# Patient Record
Sex: Male | Born: 1975 | Race: White | Hispanic: No | Marital: Married | Smoking: Former smoker
Health system: Southern US, Community
[De-identification: ages and names within clinical notes are randomized; demographics above are authoritative.]

## PROBLEM LIST (undated history)

## (undated) DIAGNOSIS — R569 Unspecified convulsions: Secondary | ICD-10-CM

## (undated) DIAGNOSIS — F32A Depression, unspecified: Secondary | ICD-10-CM

## (undated) DIAGNOSIS — F1010 Alcohol abuse, uncomplicated: Secondary | ICD-10-CM

## (undated) DIAGNOSIS — F10230 Alcohol dependence with withdrawal, uncomplicated: Secondary | ICD-10-CM

## (undated) DIAGNOSIS — F329 Major depressive disorder, single episode, unspecified: Secondary | ICD-10-CM

## (undated) HISTORY — DX: Depression, unspecified: F32.A

## (undated) HISTORY — DX: Major depressive disorder, single episode, unspecified: F32.9

## (undated) HISTORY — PX: NO PAST SURGERIES: SHX2092

---

## 2016-01-21 ENCOUNTER — Emergency Department (HOSPITAL_COMMUNITY): Payer: Self-pay

## 2016-01-21 ENCOUNTER — Encounter (HOSPITAL_COMMUNITY): Payer: Self-pay | Admitting: Emergency Medicine

## 2016-01-21 ENCOUNTER — Inpatient Hospital Stay (HOSPITAL_COMMUNITY)
Admission: EM | Admit: 2016-01-21 | Discharge: 2016-01-27 | DRG: 896 | Disposition: A | Discharge status: Home Health | Payer: Self-pay | Attending: Internal Medicine | Admitting: Internal Medicine

## 2016-01-21 DIAGNOSIS — E876 Hypokalemia: Secondary | ICD-10-CM

## 2016-01-21 DIAGNOSIS — F1020 Alcohol dependence, uncomplicated: Secondary | ICD-10-CM | POA: Diagnosis present

## 2016-01-21 DIAGNOSIS — Z781 Physical restraint status: Secondary | ICD-10-CM

## 2016-01-21 DIAGNOSIS — K760 Fatty (change of) liver, not elsewhere classified: Secondary | ICD-10-CM | POA: Diagnosis present

## 2016-01-21 DIAGNOSIS — R001 Bradycardia, unspecified: Secondary | ICD-10-CM | POA: Diagnosis present

## 2016-01-21 DIAGNOSIS — R0781 Pleurodynia: Secondary | ICD-10-CM | POA: Diagnosis present

## 2016-01-21 DIAGNOSIS — R7401 Elevation of levels of liver transaminase levels: Secondary | ICD-10-CM

## 2016-01-21 DIAGNOSIS — F329 Major depressive disorder, single episode, unspecified: Secondary | ICD-10-CM | POA: Diagnosis present

## 2016-01-21 DIAGNOSIS — R569 Unspecified convulsions: Secondary | ICD-10-CM | POA: Diagnosis present

## 2016-01-21 DIAGNOSIS — F17210 Nicotine dependence, cigarettes, uncomplicated: Secondary | ICD-10-CM | POA: Diagnosis present

## 2016-01-21 DIAGNOSIS — Z7982 Long term (current) use of aspirin: Secondary | ICD-10-CM

## 2016-01-21 DIAGNOSIS — F10230 Alcohol dependence with withdrawal, uncomplicated: Secondary | ICD-10-CM

## 2016-01-21 DIAGNOSIS — R441 Visual hallucinations: Secondary | ICD-10-CM | POA: Diagnosis present

## 2016-01-21 DIAGNOSIS — Z79899 Other long term (current) drug therapy: Secondary | ICD-10-CM

## 2016-01-21 DIAGNOSIS — F419 Anxiety disorder, unspecified: Secondary | ICD-10-CM | POA: Diagnosis present

## 2016-01-21 DIAGNOSIS — R05 Cough: Secondary | ICD-10-CM | POA: Diagnosis present

## 2016-01-21 DIAGNOSIS — G4700 Insomnia, unspecified: Secondary | ICD-10-CM | POA: Diagnosis present

## 2016-01-21 DIAGNOSIS — R740 Nonspecific elevation of levels of transaminase and lactic acid dehydrogenase [LDH]: Secondary | ICD-10-CM

## 2016-01-21 DIAGNOSIS — R451 Restlessness and agitation: Secondary | ICD-10-CM | POA: Diagnosis present

## 2016-01-21 DIAGNOSIS — F10231 Alcohol dependence with withdrawal delirium: Principal | ICD-10-CM | POA: Diagnosis present

## 2016-01-21 DIAGNOSIS — R059 Cough, unspecified: Secondary | ICD-10-CM

## 2016-01-21 DIAGNOSIS — R000 Tachycardia, unspecified: Secondary | ICD-10-CM

## 2016-01-21 DIAGNOSIS — G9340 Encephalopathy, unspecified: Secondary | ICD-10-CM | POA: Diagnosis present

## 2016-01-21 DIAGNOSIS — D696 Thrombocytopenia, unspecified: Secondary | ICD-10-CM | POA: Diagnosis present

## 2016-01-21 DIAGNOSIS — F10931 Alcohol use, unspecified with withdrawal delirium: Secondary | ICD-10-CM

## 2016-01-21 HISTORY — DX: Alcohol dependence with withdrawal, uncomplicated: F10.230

## 2016-01-21 HISTORY — DX: Alcohol abuse, uncomplicated: F10.10

## 2016-01-21 HISTORY — DX: Unspecified convulsions: R56.9

## 2016-01-21 LAB — I-STAT TROPONIN, ED: TROPONIN I, POC: 0.01 ng/mL (ref 0.00–0.08)

## 2016-01-21 LAB — COMPREHENSIVE METABOLIC PANEL
ALK PHOS: 53 U/L (ref 38–126)
ALT: 99 U/L — AB (ref 17–63)
AST: 169 U/L — ABNORMAL HIGH (ref 15–41)
Albumin: 4.3 g/dL (ref 3.5–5.0)
Anion gap: 13 (ref 5–15)
BILIRUBIN TOTAL: 1.3 mg/dL — AB (ref 0.3–1.2)
BUN: 8 mg/dL (ref 6–20)
CALCIUM: 9.8 mg/dL (ref 8.9–10.3)
CO2: 25 mmol/L (ref 22–32)
CREATININE: 0.92 mg/dL (ref 0.61–1.24)
Chloride: 99 mmol/L — ABNORMAL LOW (ref 101–111)
Glucose, Bld: 92 mg/dL (ref 65–99)
Potassium: 3.2 mmol/L — ABNORMAL LOW (ref 3.5–5.1)
Sodium: 137 mmol/L (ref 135–145)
TOTAL PROTEIN: 7.4 g/dL (ref 6.5–8.1)

## 2016-01-21 LAB — CBC
HEMATOCRIT: 38.6 % — AB (ref 39.0–52.0)
HEMOGLOBIN: 13 g/dL (ref 13.0–17.0)
MCH: 33.7 pg (ref 26.0–34.0)
MCHC: 33.7 g/dL (ref 30.0–36.0)
MCV: 100 fL (ref 78.0–100.0)
PLATELETS: 128 10*3/uL — AB (ref 150–400)
RBC: 3.86 MIL/uL — AB (ref 4.22–5.81)
RDW: 12 % (ref 11.5–15.5)
WBC: 6 10*3/uL (ref 4.0–10.5)

## 2016-01-21 LAB — ETHANOL

## 2016-01-21 MED ORDER — LORAZEPAM 2 MG/ML IJ SOLN: 1.0000 mg | Freq: Four times a day (QID) | INTRAMUSCULAR | Status: DC | PRN

## 2016-01-21 MED ORDER — ACETAMINOPHEN 325 MG PO TABS: 650.0000 mg | ORAL_TABLET | Freq: Four times a day (QID) | ORAL | Status: DC | PRN

## 2016-01-21 MED ORDER — LORAZEPAM 2 MG/ML IJ SOLN: 0.0000 mg | Freq: Four times a day (QID) | INTRAMUSCULAR | Status: DC

## 2016-01-21 MED ORDER — FOLIC ACID 1 MG PO TABS: 1.0000 mg | ORAL_TABLET | Freq: Once | ORAL | Status: DC

## 2016-01-21 MED ORDER — ONDANSETRON HCL 4 MG/2ML IJ SOLN: 4.0000 mg | Freq: Four times a day (QID) | INTRAMUSCULAR | Status: DC | PRN

## 2016-01-21 MED ORDER — LORAZEPAM 2 MG/ML IJ SOLN
2.0000 mg | INTRAMUSCULAR | Status: DC | PRN
  Administered 2016-01-22 (×2): 2 mg via INTRAVENOUS
  Administered 2016-01-22: 3 mg via INTRAVENOUS
  Administered 2016-01-22 – 2016-01-23 (×3): 2 mg via INTRAVENOUS
  Administered 2016-01-23: 3 mg via INTRAVENOUS
  Administered 2016-01-23: 2 mg via INTRAVENOUS
  Filled 2016-01-21 (×2): qty 1
  Filled 2016-01-21: qty 2
  Filled 2016-01-21: qty 1
  Filled 2016-01-21: qty 2
  Filled 2016-01-21 (×2): qty 1
  Filled 2016-01-21: qty 2
  Filled 2016-01-21: qty 1

## 2016-01-21 MED ORDER — SODIUM CHLORIDE 0.9 % IV BOLUS (SEPSIS)
1000.0000 mL | Freq: Once | INTRAVENOUS | Status: AC
  Administered 2016-01-21: 1000 mL via INTRAVENOUS

## 2016-01-21 MED ORDER — THIAMINE HCL 100 MG/ML IJ SOLN
100.0000 mg | Freq: Every day | INTRAMUSCULAR | Status: DC
  Administered 2016-01-22 – 2016-01-24 (×3): 100 mg via INTRAVENOUS
  Filled 2016-01-21 (×5): qty 2

## 2016-01-21 MED ORDER — LORAZEPAM 2 MG/ML IJ SOLN: 0.0000 mg | Freq: Two times a day (BID) | INTRAMUSCULAR | Status: DC

## 2016-01-21 MED ORDER — ONDANSETRON HCL 4 MG PO TABS: 4.0000 mg | ORAL_TABLET | Freq: Four times a day (QID) | ORAL | Status: DC | PRN

## 2016-01-21 MED ORDER — SODIUM CHLORIDE 0.9% FLUSH
3.0000 mL | Freq: Two times a day (BID) | INTRAVENOUS | Status: DC
  Administered 2016-01-22 – 2016-01-27 (×8): 3 mL via INTRAVENOUS

## 2016-01-21 MED ORDER — THIAMINE HCL 100 MG/ML IJ SOLN: 100.0000 mg | Freq: Once | INTRAMUSCULAR | Status: DC

## 2016-01-21 MED ORDER — ENOXAPARIN SODIUM 40 MG/0.4ML ~~LOC~~ SOLN
40.0000 mg | Freq: Every day | SUBCUTANEOUS | Status: DC
  Administered 2016-01-21 – 2016-01-24 (×4): 40 mg via SUBCUTANEOUS
  Filled 2016-01-21 (×4): qty 0.4

## 2016-01-21 MED ORDER — LORAZEPAM 1 MG PO TABS: 1.0000 mg | ORAL_TABLET | Freq: Four times a day (QID) | ORAL | Status: DC | PRN

## 2016-01-21 MED ORDER — FOLIC ACID 5 MG/ML IJ SOLN
1.0000 mg | Freq: Every day | INTRAMUSCULAR | Status: DC
  Administered 2016-01-22 – 2016-01-24 (×2): 1 mg via INTRAVENOUS
  Filled 2016-01-21 (×7): qty 0.2

## 2016-01-21 MED ORDER — ACETAMINOPHEN 650 MG RE SUPP: 650.0000 mg | Freq: Four times a day (QID) | RECTAL | Status: DC | PRN

## 2016-01-21 MED ORDER — ADULT MULTIVITAMIN W/MINERALS CH
1.0000 | ORAL_TABLET | Freq: Once | ORAL | Status: DC
Start: 2016-01-21 — End: 2016-01-21

## 2016-01-21 MED ORDER — LACTATED RINGERS IV SOLN
INTRAVENOUS | Status: DC
  Administered 2016-01-21: 23:00:00 via INTRAVENOUS

## 2016-01-21 MED ORDER — POTASSIUM CHLORIDE CRYS ER 20 MEQ PO TBCR
40.0000 meq | EXTENDED_RELEASE_TABLET | Freq: Once | ORAL | Status: DC
Start: 2016-01-21 — End: 2016-01-21

## 2016-01-21 MED ORDER — SODIUM CHLORIDE 0.9 % IV SOLN: Freq: Once | INTRAVENOUS | Status: DC

## 2016-01-21 MED ORDER — LORAZEPAM 2 MG/ML IJ SOLN
1.0000 mg | Freq: Once | INTRAMUSCULAR | Status: AC
  Administered 2016-01-21: 1 mg via INTRAVENOUS
  Filled 2016-01-21: qty 1

## 2016-01-21 MED ORDER — POTASSIUM CHLORIDE CRYS ER 20 MEQ PO TBCR
40.0000 meq | EXTENDED_RELEASE_TABLET | Freq: Once | ORAL | Status: AC
  Administered 2016-01-21: 40 meq via ORAL
  Filled 2016-01-21: qty 2

## 2016-01-21 NOTE — ED Provider Notes (Signed)
[facility]-EMERGENCY DEPT Provider Note   CSN: [id number] Arrival date & time: 01/21/16  1431     History   Chief Complaint Chief Complaint  Patient presents with  . Seizures    HPI Timothy [name] is a 40 y.o. male.  HPI Timothy [name] is a 40 y.o. male with hx of ETOH abuse and hx of one prior alcohol withdrawal seizure in 2004, presents to ED with complaint of a seizure. Pt states he was at work and remembers feeling hungry and realizing he has had nothing to eat today. States went to get food when he had a seizure. Pt with tonic clonic activity lasting several minutes. States pt remembers when EMS already on the scene taking him to the hospital. Pt did have positive urinary incontinence. Pt is postictal after his seizure. Pt complaining of chest pain. States "feels like someone hit him in the chest." pr reports that he still drinks alcohol daily. Pt's father pulled us into the hallway and told us that pt is a very heavy drinker. PT states over the weekend he has cut down on alcohol due to being sick with the flu like symptoms. States he had nausea, vomiting and he hasnt been able to drink as much. States vomiting improved yesterday but states he did keep few drinks down last night. States was feeling OK this morning.   Past Medical History:  Diagnosis Date  . ETOH abuse   . Seizure due to alcohol withdrawal, uncomplicated (HCC)    2004  . Seizures (HCC)     There are no active problems to display for this patient.   History reviewed. No pertinent surgical history.     Home Medications    Prior to Admission medications   Not on File    Family History No family history on file.  Social History Social History  Substance Use Topics  . Smoking status: Current Some Day Smoker  . Smokeless tobacco: Current User    Types: Chew  . Alcohol use 27.0 oz/week    45 Cans of beer per week     Allergies   Patient has no known allergies.   Review of  Systems Review of Systems  Constitutional: Negative for chills and fever.  Respiratory: Negative for cough, chest tightness and shortness of breath.   Cardiovascular: Positive for chest pain. Negative for palpitations and leg swelling.  Gastrointestinal: Negative for abdominal distention, abdominal pain, diarrhea, nausea and vomiting.  Genitourinary: Negative for dysuria, frequency, hematuria and urgency.  Musculoskeletal: Negative for arthralgias, myalgias, neck pain and neck stiffness.  Skin: Negative for rash.  Allergic/Immunologic: Negative for immunocompromised state.  Neurological: Positive for tremors and seizures. Negative for dizziness, weakness, light-headedness, numbness and headaches.  All other systems reviewed and are negative.    Physical Exam Updated Vital Signs BP 115/86 (BP Location: Right Arm)   Pulse 102   Temp 98.1 F (36.7 C) (Oral)   Resp 13   Ht 5\' 8"  (1.727 m)   Wt 62.1 kg   SpO2 100%   BMI 20.83 kg/m   Physical Exam  Constitutional: He appears well-developed and well-nourished. No distress.  HENT:  Head: Normocephalic and atraumatic.  Eyes: Conjunctivae and EOM are normal. Pupils are equal, round, and reactive to light.  Neck: Normal range of motion. Neck supple.  Cardiovascular: Normal rate, regular rhythm and normal heart sounds.   Pulmonary/Chest: Effort normal. No respiratory distress. He has no wheezes. He has no rales.  Abdominal: Soft. Bowel sounds  are normal. He exhibits no distension. There is no tenderness. There is no rebound.  Musculoskeletal: He exhibits no edema.  Neurological: He is alert.  Intentional tremmor in both hands and legs. 5/5 and equal upper and lower extremity strength bilaterally. Equal grip strength bilaterally. Normal finger to nose and heel to shin. No pronator drift. Patellar reflexes 2+   Skin: Skin is warm and dry.  Nursing note and vitals reviewed.    ED Treatments / Results  Labs (all labs ordered are  listed, but only abnormal results are displayed) Labs Reviewed  CBC - Abnormal; Notable for the following:       Result Value   RBC 3.86 (*)    HCT 38.6 (*)    Platelets 128 (*)    All other components within normal limits  COMPREHENSIVE METABOLIC PANEL - Abnormal; Notable for the following:    Potassium 3.2 (*)    Chloride 99 (*)    AST 169 (*)    ALT 99 (*)    Total Bilirubin 1.3 (*)    All other components within normal limits  ETHANOL  CBG MONITORING, ED  Rosezena SensorI-STAT TROPOININ, ED    EKG  EKG Interpretation  Date/Time:  Tuesday January 21 2016 14:34:37 EST Ventricular Rate:  100 PR Interval:    QRS Duration: 85 QT Interval:  337 QTC Calculation: 435 R Axis:   63 Text Interpretation:  Sinus tachycardia Confirmed by Particia NearingHAVILAND MD, JULIE ([id number]) on 01/21/2016 6:03:05 PM       Radiology Dg Chest 2 View  Result Date: 01/21/2016 CLINICAL DATA:  The patient reports 4-5 days pleuritic chest pain radiating to the shoulder blades. Current smoker. Patient has history of seizure activity and a seizure this afternoon. EXAM: CHEST  2 VIEW COMPARISON:  None in PACs FINDINGS: The lungs are well-expanded and clear. There is no pneumothorax, pneumomediastinum, or pleural effusion. There is no evidence of aspiration. The heart and pulmonary vascularity are normal. The mediastinum is normal in width. The bony thorax exhibits no acute abnormality. IMPRESSION: There is no active cardiopulmonary disease. Electronically Signed   By: David  SwazilandJordan M.D.   On: 01/21/2016 16:35    Procedures Procedures (including critical care time)  Medications Ordered in ED Medications  multivitamin with minerals tablet 1 tablet (not administered)  thiamine (B-1) injection 100 mg (not administered)  folic acid (FOLVITE) tablet 1 mg (not administered)  potassium chloride SA (K-DUR,KLOR-CON) CR tablet 40 mEq (not administered)  0.9 %  sodium chloride infusion (not administered)  LORazepam (ATIVAN) tablet 1 mg  (not administered)    Or  LORazepam (ATIVAN) injection 1 mg (not administered)  LORazepam (ATIVAN) injection 0-4 mg (not administered)    Followed by  LORazepam (ATIVAN) injection 0-4 mg (not administered)  sodium chloride 0.9 % bolus 1,000 mL (1,000 mLs Intravenous New Bag/Given 01/21/16 1614)  LORazepam (ATIVAN) injection 1 mg (1 mg Intravenous Given 01/21/16 1609)  sodium chloride 0.9 % bolus 1,000 mL (1,000 mLs Intravenous New Bag/Given 01/21/16 1607)     Initial Impression / Assessment and Plan / ED Course  I have reviewed the triage vital signs and the nursing notes.  Pertinent labs & imaging results that were available during my care of the patient were reviewed by me and considered in my medical decision making (see chart for details).  Clinical Course     Pt in ED with seizure episode. Hx of heavy alcohol use with recent decrease in use due to flu like illness. Last drink  last night. Pt is tremulous, tachycardic, otherwise non toxic appearing. Will give fluids, CIWA, ativan for tremors and suspected withdrawal. Will give thiamine and folic acid. Will monitor. Labs and CXR for chest pain ordered.   5:48 PM Labs showing mildly elevated LFTs and bili, most likely due to his drinking. His potassium 3.2, given in ED. Pt feels better after ativan. Still with mild tremor. Pt does have some tremor at baseline according to dad. Patient has no evidence of hallucination, not tachycardic or hypertensive any longer after hydration, I do not think he is in DTs. We had a very long discussion about alcohol cessation, and patient stated that he is just not ready to quit drinking just yet. He was given option of admission for detox versus outpatient resources. He initially decided that he wanted to leave, however I was called back to the room by family stating that he is ready to be admitted. When I talked to them about admission again, and that admission would be for few days, patient again  refused.  Patient changed his mind and stated that he is ready to quit drinking. Given his history of seizures with withdraws, he will need inpatient admission. Spoke which I have, they will admit patient to stepdown. The patient was placed on CIWA protocol.   Final Clinical Impressions(s) / ED Diagnoses   Final diagnoses:  Alcohol withdrawal seizure without complication [facility])    New Prescriptions New Prescriptions   No medications on file     Jaynie Crumble, PA-C 01/21/16 2005    Jacalyn Lefevre, MD 01/21/16 2139

## 2016-01-21 NOTE — ED Triage Notes (Signed)
Pt arrives from work vis [facility] reporting clonic tonic seizure lasting 3-5 minutes.  EMS reports onset was witnessed, pt lowered from chair to floor with no fall or trauma.  EMS reports post-ictal period lasted approx 25 minutes.  Urinary incontinence noted.  Pt reports previous seizure r/t ETOH withdrawal, states last drink last night. Pt denies change to ETOH use.  Pt AOx4, disoriented to age. Resp e/u, NAD noted at this time.

## 2016-01-21 NOTE — H&P (Signed)
History and Physical    Timothy [name] ZOX:[id number] DOB: Oct 18, 1975 DOA: 01/21/2016  PCP: Pcp Not In System Consultants:  None Patient coming from: home - lives with wife; NOK: wife, [phone]  Chief Complaint: seizure  HPI: Timothy [name] is a 40 y.o. male with medical history significant of alcohol dependence and 1 prior alcohol withdrawal seizure presenting with the same.  Patient had a seizure at work today.  Co-workers reported that it was very intense.  Frothing at the mouth, grunting, "stiff as a board but flopping like a fish."  The last thing he remembers was taking a break for lunch and he stood up and his training partner asked a question (he doesn't remember anything after that).  Remembers waking up after the seizure when EMS showed up.  Remembers the ride over but wasn't answering questions correctly at that time.  Denies aura.  Last night at home, had some audio hallucinations.  Also with some visual hallucinations.    Generally drinks 4-6 beers per night Mon-Fri.  Saturdays, 12-15 beers and also on Sunday.  Recently had flu-like illness - unable to work Thursday-Friday, very sick Saturday-Sunday.  Did not drink ETOH at all Monday, but tried drinking through the weekend.  Slept through the day yesterday, trying to get back to work today.    Has had previous seizure about 6 years ago, just once.  Then, heavy use of vodka.  Was on family vacation and decided to stop drinking.  Was hearing music in the walls and unplugged everything but couldn't get the music to stop.  Was treated at the ER in [place] that time.  Went to rehab at [facility] once.  Stayed 2 nights, and things went very well.  No h/o long-term rehab.  Has never been involved in AA.   ED Course: Per PA-C Kirichenko: Pt in ED with seizure episode. Hx of heavy alcohol use with recent decrease in use due to flu like illness. Last drink last night. Pt is tremulous, tachycardic, otherwise non toxic  appearing. Will give fluids, CIWA, ativan for tremors and suspected withdrawal. Will give thiamine and folic acid. Will monitor. Labs and CXR for chest pain ordered.  5:48 PM  Labs showing mildly elevated LFTs and bili, most likely due to his drinking. His potassium 3.2, given in ED. Pt feels better after ativan. Still with mild tremor. Pt does have some tremor at baseline according to dad. Patient has no evidence of hallucination, not tachycardic or hypertensive any longer after hydration, I do not think he is in DTs. We had a very long discussion about alcohol cessation, and patient stated that he is just not ready to quit drinking just yet. He was given option of admission for detox versus outpatient resources. He initially decided that he wanted to leave, however I was called back to the room by family stating that he is ready to be admitted. When I talked to them about admission again, and that admission would be for few days, patient again refused.  Patient changed his mind and stated that he is ready to quit drinking. Given his history of seizures with withdraws, he will need inpatient admission. Spoke which I have, they will admit patient to stepdown. The patient was placed on CIWA protocol.    Review of Systems: As per HPI; otherwise 10 point review of systems reviewed and negative.   Ambulatory Status:  Ambulates without assistance  Past Medical History:  Diagnosis Date  . ETOH abuse   .  Seizure due to alcohol withdrawal, uncomplicated (HCC)    2004    History reviewed. No pertinent surgical history.  Social History   Social History  . Marital status: Married    Spouse name: N/A  . Number of children: N/A  . Years of education: N/A   Occupational History  . call center supervisor    Social History Main Topics  . Smoking status: Current Some Day Smoker    Packs/day: 0.20  . Smokeless tobacco: Current User    Types: Chew  . Alcohol use 27.0 oz/week    45 Cans of beer  per week  . Drug use: No  . Sexual activity: Not on file   Other Topics Concern  . Not on file   Social History Narrative  . No narrative on file    No Known Allergies  Family History  Problem Relation Age of Onset  . Arthritis Father     rheumatoid  . Alcoholism Paternal Uncle   . Alcoholism Maternal Uncle     Prior to Admission medications   Medication Sig Start Date End Date Taking? Authorizing Provider  acetaminophen (TYLENOL) 500 MG tablet Take 1,000 mg by mouth every 6 (six) hours as needed.   Yes Historical Provider, MD  aspirin 325 MG tablet Take 325 mg by mouth every 6 (six) hours as needed.   Yes Historical Provider, MD  Aspirin-Acetaminophen-Caffeine (GOODY HEADACHE PO) Take 1 Package by mouth daily as needed (pain).   Yes Historical Provider, MD  guaiFENesin (MUCINEX) 600 MG 12 hr tablet Take 600 mg by mouth 2 (two) times daily as needed.   Yes Historical Provider, MD  sodium-potassium bicarbonate (ALKA-SELTZER GOLD) TBEF dissolvable tablet Take 2 tablets by mouth daily as needed.   Yes Historical Provider, MD    Physical Exam: Vitals:   01/22/16 0000 01/22/16 0100 01/22/16 0148 01/22/16 0200  BP:      Pulse: 100 87 78 (!) 104  Resp:      Temp:      TempSrc:      SpO2:      Weight:      Height:         General:  Pleasant, Appears calm but is fidgety, appears to be in withdrawal Eyes:  PERRL, EOMI, normal lids, iris ENT:  grossly normal hearing, lips & tongue, mmm Neck:  no LAD, masses or thyromegaly Cardiovascular:  Mild tachycardia, no m/r/g. No LE edema.  Respiratory:  CTA bilaterally, no w/r/r. Normal respiratory effort. Abdomen:  soft, ntnd, NABS Skin:  no rash or induration seen on limited exam Musculoskeletal:  grossly normal tone BUE/BLE, good ROM, no bony abnormality Psychiatric:  grossly normal mood and affect, speech fluent and appropriate, AOx3 Neurologic:  CN 2-12 grossly intact, moves all extremities in coordinated fashion, sensation  intact  Labs on Admission: I have personally reviewed following labs and imaging studies  CBC:  Recent Labs Lab 01/21/16 1526  WBC 6.0  HGB 13.0  HCT 38.6*  MCV 100.0  PLT 128*   Basic Metabolic Panel:  Recent Labs Lab 01/21/16 1526  NA 137  K 3.2*  CL 99*  CO2 25  GLUCOSE 92  BUN 8  CREATININE 0.92  CALCIUM 9.8   GFR: Estimated Creatinine Clearance: 95.6 mL/min (by C-G formula based on SCr of 0.92 mg/dL). Liver Function Tests:  Recent Labs Lab 01/21/16 1526  AST 169*  ALT 99*  ALKPHOS 53  BILITOT 1.3*  PROT 7.4  ALBUMIN 4.3   No  results for input(s): LIPASE, AMYLASE in the last 168 hours. No results for input(s): AMMONIA in the last 168 hours. Coagulation Profile: No results for input(s): INR, PROTIME in the last 168 hours. Cardiac Enzymes: No results for input(s): CKTOTAL, CKMB, CKMBINDEX, TROPONINI in the last 168 hours. BNP (last 3 results) No results for input(s): PROBNP in the last 8760 hours. HbA1C: No results for input(s): HGBA1C in the last 72 hours. CBG: No results for input(s): GLUCAP in the last 168 hours. Lipid Profile: No results for input(s): CHOL, HDL, LDLCALC, TRIG, CHOLHDL, LDLDIRECT in the last 72 hours. Thyroid Function Tests: No results for input(s): TSH, T4TOTAL, FREET4, T3FREE, THYROIDAB in the last 72 hours. Anemia Panel: No results for input(s): VITAMINB12, FOLATE, FERRITIN, TIBC, IRON, RETICCTPCT in the last 72 hours. Urine analysis: No results found for: COLORURINE, APPEARANCEUR, LABSPEC, PHURINE, GLUCOSEU, HGBUR, BILIRUBINUR, KETONESUR, PROTEINUR, UROBILINOGEN, NITRITE, LEUKOCYTESUR  Creatinine Clearance: Estimated Creatinine Clearance: 95.6 mL/min (by C-G formula based on SCr of 0.92 mg/dL).  Sepsis Labs: @LABRCNTIP (procalcitonin:4,lacticidven:4) ) Recent Results (from the past 240 hour(s))  MRSA PCR Screening     Status: None   Collection Time: 01/21/16  9:58 PM  Result Value Ref Range Status   MRSA by PCR  NEGATIVE NEGATIVE Final    Comment:        The GeneXpert MRSA Assay (FDA approved for NASAL specimens only), is one component of a comprehensive MRSA colonization surveillance program. It is not intended to diagnose MRSA infection nor to guide or monitor treatment for MRSA infections.      Radiological Exams on Admission: Dg Chest 2 View  Result Date: 01/21/2016 CLINICAL DATA:  The patient reports 4-5 days pleuritic chest pain radiating to the shoulder blades. Current smoker. Patient has history of seizure activity and a seizure this afternoon. EXAM: CHEST  2 VIEW COMPARISON:  None in PACs FINDINGS: The lungs are well-expanded and clear. There is no pneumothorax, pneumomediastinum, or pleural effusion. There is no evidence of aspiration. The heart and pulmonary vascularity are normal. The mediastinum is normal in width. The bony thorax exhibits no acute abnormality. IMPRESSION: There is no active cardiopulmonary disease. Electronically Signed   By: David  Swazil[place]Jordan M.D.   On: 01/21/2016 16:35    EKG: Independently reviewed.  Sinus tachycardia with rate 100; no evidence of acute ischemia  Assessment/Plan Principal Problem:   Alcohol withdrawal seizure (HCC) Active Problems:   Alcohol dependence (HCC)   Hypokalemia   Alcohol withdrawal seizure, alcoholism -Patient with h/o prior seizure x 1 -Very likely related to ETOH withdrawal -Likely needs 6615-month driving restriction after discharge -Based on seizure despite drinking alcohol (just less than usual with acute illness), audiovisual hallucinations, and patient's obvious discomfort at the time of eval, his risk for serious DTs is quite high -Will admit to SDU with CIWA protocol -He seems to have a high risk for needing Precedex to help him get through DTs -I attempted to call the [facility] physician and did not receive a return call.  There is no current urgency, but if the patient struggles we are likely to need PCCM support. -Folate  and thiamine ordered -Patient reports desire to quit.  Will likely need long-term treatment program.   -SW consult requested. -Will also check UDS. -Mild alcoholic hepatitis: AST 169, ALT 99, Bili 1.3  Hypokalemia -Will replete via 40 mEq KCl PO x 1 -Recheck in AM   DVT prophylaxis:  Lovenox  Code Status:  Full - confirmed with patient/family Family Communication: Wife, mother present  throughout evaluation  Disposition Plan:  Home once clinically improved Consults called: SW  Admission status: Admit - It is my clinical opinion that admission to INPATIENT is reasonable and necessary because this patient will require at least 2 midnights in the hospital to treat this condition based on the medical complexity of the problems presented.  Given the aforementioned information, the predictability of an adverse outcome is felt to be significant.     Jonah BlueJennifer Dacotah Cabello MD [facility]  If 7PM-7AM, please contact night-coverage [email or web address] Password [email or web address]  01/22/2016, 2:36 AM

## 2016-01-22 ENCOUNTER — Inpatient Hospital Stay (HOSPITAL_COMMUNITY): Payer: Self-pay

## 2016-01-22 DIAGNOSIS — F1020 Alcohol dependence, uncomplicated: Secondary | ICD-10-CM | POA: Diagnosis present

## 2016-01-22 DIAGNOSIS — E876 Hypokalemia: Secondary | ICD-10-CM | POA: Diagnosis present

## 2016-01-22 LAB — COMPREHENSIVE METABOLIC PANEL WITH GFR
ALT: 84 U/L — ABNORMAL HIGH (ref 17–63)
AST: 117 U/L — ABNORMAL HIGH (ref 15–41)
Albumin: 4.2 g/dL (ref 3.5–5.0)
Alkaline Phosphatase: 57 U/L (ref 38–126)
Anion gap: 11 (ref 5–15)
BUN: 6 mg/dL (ref 6–20)
CO2: 25 mmol/L (ref 22–32)
Calcium: 9.4 mg/dL (ref 8.9–10.3)
Chloride: 103 mmol/L (ref 101–111)
Creatinine, Ser: 0.79 mg/dL (ref 0.61–1.24)
GFR calc Af Amer: >60 mL/min
GFR calc non Af Amer: >60 mL/min
Glucose, Bld: 83 mg/dL (ref 65–99)
Potassium: 3.5 mmol/L (ref 3.5–5.1)
Sodium: 139 mmol/L (ref 135–145)
Total Bilirubin: 1 mg/dL (ref 0.3–1.2)
Total Protein: 7.2 g/dL (ref 6.5–8.1)

## 2016-01-22 LAB — PROTIME-INR
INR: 1.08
PROTHROMBIN TIME: 14 s (ref 11.4–15.2)

## 2016-01-22 LAB — RAPID URINE DRUG SCREEN, HOSP PERFORMED
Amphetamines: NOT DETECTED
Barbiturates: POSITIVE — AB
Benzodiazepines: POSITIVE — AB
Cocaine: NOT DETECTED
OPIATES: NOT DETECTED
TETRAHYDROCANNABINOL: NOT DETECTED

## 2016-01-22 LAB — MRSA PCR SCREENING: MRSA by PCR: NEGATIVE

## 2016-01-22 MED ORDER — DICLOFENAC SODIUM 1 % TD GEL
2.0000 g | Freq: Four times a day (QID) | TRANSDERMAL | Status: DC
  Administered 2016-01-22 – 2016-01-23 (×5): 2 g via TOPICAL
  Filled 2016-01-22: qty 100

## 2016-01-22 MED ORDER — HALOPERIDOL LACTATE 5 MG/ML IJ SOLN
3.0000 mg | Freq: Once | INTRAMUSCULAR | Status: AC
  Administered 2016-01-22: 3 mg via INTRAVENOUS
  Filled 2016-01-22: qty 1

## 2016-01-22 MED ORDER — INFLUENZA VAC SPLIT QUAD 0.5 ML IM SUSY: 0.5000 mL | PREFILLED_SYRINGE | INTRAMUSCULAR | Status: DC

## 2016-01-22 MED ORDER — HALOPERIDOL LACTATE 5 MG/ML IJ SOLN: 5.0000 mg | Freq: Once | INTRAMUSCULAR | Status: DC

## 2016-01-22 NOTE — Progress Notes (Signed)
PROGRESS NOTE    Timothy [name]  ZOX:[id number] DOB: 07/03/1975 DOA: 01/21/2016  PCP: Pcp Not In System   HPI:  Timothy [name] is a 40 y.o. male with medical history significant of alcohol dependence and 1 prior alcohol withdrawal seizure presenting with the same.  Patient had a seizure at work today.  Co-workers reported that it was very intense.  Frothing at the mouth, grunting, "stiff as a board but flopping like a fish."  The last thing he remembers was taking a break for lunch and he stood up and his training partner asked a question (he doesn't remember anything after that).  Remembers waking up after the seizure when EMS showed up.  Remembers the ride over but wasn't answering questions correctly at that time.  Denies aura.  Last night at home, had some audio hallucinations.  Also with some visual hallucinations.    Generally drinks 4-6 beers per night Mon-Fri.  Saturdays, 12-15 beers and also on "Sunday.  Recently had flu-like illness - unable to work Thursday-Friday, very sick Saturday-Sunday.  Did not drink ETOH at all Monday, but tried drinking through the weekend.  Slept through the day yesterday, trying to get back to work today.    Has had previous seizure about 6 years ago, just once.  Then, heavy use of vodka.  Was on family vacation and decided to stop drinking.  Was hearing music in the walls and unplugged everything but couldn't get the music to stop.  Was treated at the ER in [place] that time.  Went to rehab at [facility] once.  Stayed 2 nights, and things went very well.  No h/o long-term rehab.  Has never been involved in AA.   Subjective: Per family, still confused and having hallucinations. His history of past few days events is no fully accurate. Wife and mother feels he is quite depressed.   Assessment & Plan:   Principal Problem:   Alcohol withdrawal seizure     Alcohol dependence - SDU monitoring. CIWA Scale with Ativan - social service  consulted for outpt assistance programs to quit drinking  Depression - family feels that he would benefit from therapy rather than meds- social work consulted to give info about outpt programs    Hypokalemia - replaced  DVT prophylaxis: Lovenox Code Status: Full code Family Communication: wife and mother Disposition Plan: home when withdrawal resolved Consultants:    Procedures:    Antimicrobials:  Anti-infectives    None       Objective: Vitals:   01/22/16 0904 01/22/16 1014 01/22/16 1100 01/22/16 1616  BP: (!) 150/95 (!) 136/96 (!) 147/93 (!) 147/97  Pulse: 87 89 74 75  Resp:   18 (!) 23  Temp:   100.3 F (37.9 C) 98.7 F (37.1 C)  TempSrc:   Oral Oral  SpO2:   100% 99%  Weight:      Height:        Intake/Output Summary (Last 24 hours) at 01/22/16 1720 Last data filed at 01/22/16 1600  Gross per 24 hour  Intake          5385.84 ml  Output             2800 ml  Net          25" 85.84 ml   Filed Weights   01/21/16 1440  Weight: 62.1 kg (137 lb)    Examination: General exam: Appears comfortable  HEENT: PERRLA, oral mucosa moist, no sclera icterus or thrush Respiratory system:  Clear to auscultation. Respiratory effort normal. Cardiovascular system: S1 & S2 heard, RRR.  No murmurs  Gastrointestinal system: Abdomen soft, non-tender, nondistended. Normal bowel sound. No organomegaly Central nervous system: Alert and oriented. No focal neurological deficits. Extremities: No cyanosis, clubbing or edema Skin: No rashes or ulcers Psychiatry:  Mood & affect appropriate.     Data Reviewed: I have personally reviewed following labs and imaging studies  CBC:  Recent Labs Lab 01/21/16 1526  WBC 6.0  HGB 13.0  HCT 38.6*  MCV 100.0  PLT 128*   Basic Metabolic Panel:  Recent Labs Lab 01/21/16 1526 01/22/16 0337  NA 137 139  K 3.2* 3.5  CL 99* 103  CO2 25 25  GLUCOSE 92 83  BUN 8 6  CREATININE 0.92 0.79  CALCIUM 9.8 9.4   GFR: Estimated  Creatinine Clearance: 110 mL/min (by C-G formula based on SCr of 0.79 mg/dL). Liver Function Tests:  Recent Labs Lab 01/21/16 1526 01/22/16 0337  AST 169* 117*  ALT 99* 84*  ALKPHOS 53 57  BILITOT 1.3* 1.0  PROT 7.4 7.2  ALBUMIN 4.3 4.2   No results for input(s): LIPASE, AMYLASE in the last 168 hours. No results for input(s): AMMONIA in the last 168 hours. Coagulation Profile:  Recent Labs Lab 01/22/16 0337  INR 1.08   Cardiac Enzymes: No results for input(s): CKTOTAL, CKMB, CKMBINDEX, TROPONINI in the last 168 hours. BNP (last 3 results) No results for input(s): PROBNP in the last 8760 hours. HbA1C: No results for input(s): HGBA1C in the last 72 hours. CBG: No results for input(s): GLUCAP in the last 168 hours. Lipid Profile: No results for input(s): CHOL, HDL, LDLCALC, TRIG, CHOLHDL, LDLDIRECT in the last 72 hours. Thyroid Function Tests: No results for input(s): TSH, T4TOTAL, FREET4, T3FREE, THYROIDAB in the last 72 hours. Anemia Panel: No results for input(s): VITAMINB12, FOLATE, FERRITIN, TIBC, IRON, RETICCTPCT in the last 72 hours. Urine analysis: No results found for: COLORURINE, APPEARANCEUR, LABSPEC, PHURINE, GLUCOSEU, HGBUR, BILIRUBINUR, KETONESUR, PROTEINUR, UROBILINOGEN, NITRITE, LEUKOCYTESUR Sepsis Labs: @LABRCNTIP (procalcitonin:4,lacticidven:4) ) Recent Results (from the past 240 hour(s))  MRSA PCR Screening     Status: None   Collection Time: 01/21/16  9:58 PM  Result Value Ref Range Status   MRSA by PCR NEGATIVE NEGATIVE Final    Comment:        The GeneXpert MRSA Assay (FDA approved for NASAL specimens only), is one component of a comprehensive MRSA colonization surveillance program. It is not intended to diagnose MRSA infection nor to guide or monitor treatment for MRSA infections.          Radiology Studies: Dg Chest 2 View  Result Date: 01/21/2016 CLINICAL DATA:  The patient reports 4-5 days pleuritic chest pain radiating to the  shoulder blades. Current smoker. Patient has history of seizure activity and a seizure this afternoon. EXAM: CHEST  2 VIEW COMPARISON:  None in PACs FINDINGS: The lungs are well-expanded and clear. There is no pneumothorax, pneumomediastinum, or pleural effusion. There is no evidence of aspiration. The heart and pulmonary vascularity are normal. The mediastinum is normal in width. The bony thorax exhibits no acute abnormality. IMPRESSION: There is no active cardiopulmonary disease. Electronically Signed   By: David  [place] M.D.   On: 01/21/2016 16:35   Ct Head Wo Contrast  Result Date: 01/22/2016 CLINICAL DATA:  Alcohol withdrawal with seizure, altered mental status EXAM: CT HEAD WITHOUT CONTRAST TECHNIQUE: Contiguous axial images were obtained from the base of the skull through the vertex without  intravenous contrast. COMPARISON:  None. FINDINGS: Brain: No evidence of acute infarction, hemorrhage, hydrocephalus, extra-axial collection or mass lesion/mass effect. Vascular: No hyperdense vessel or unexpected calcification. Skull: Normal. Negative for fracture or focal lesion. Sinuses/Orbits: Moderate mucosal thickening in the maxillary sinuses with fluid level on the right. Mucosal thickening in the ethmoid sinuses. No acute orbital abnormality. Other: None IMPRESSION: 1. No CT evidence for acute intracranial abnormality. 2. Sinusitis Electronically Signed   By: Jasmine PangKim  Fujinaga M.D.   On: 01/22/2016 14:57      Scheduled Meds: . diclofenac sodium  2 g Topical QID  . enoxaparin (LOVENOX) injection  40 mg Subcutaneous QHS  . folic acid  1 mg Intravenous Daily  . [START ON 01/23/2016] Influenza vac split quadrivalent PF  0.5 mL Intramuscular Tomorrow-1000  . sodium chloride flush  3 mL Intravenous Q12H  . thiamine  100 mg Intravenous Daily   Continuous Infusions:   LOS: 1 day    Time spent in minutes: 35    Sanad Fearnow, MD [facility] Pager: [email or web address] Password [email or web address] 01/22/2016,  5:20 PM

## 2016-01-22 NOTE — Progress Notes (Signed)
Patient is now calmly resting and not showing increasing withdrawal symptoms after 3mg  dose of haldol. Will continue to monitor.

## 2016-01-22 NOTE — Care Management Note (Addendum)
Case Management Note  Patient Details  Name: Timothy [name] MRN: [id number] Date of Birth: 07/25/1975  Subjective/Objective:   [place], he needs a PCP, he has no medication coverage but he does have transportation.  NCM contacted the  [facility] at [phone] and spoke with Tonna CornerLily, she states she will give the information to the Nurse and she will be able to schedule a hospital follow up for patient.  The Nurse will contact patient at [phone] and set up apt, it may take 3 days for her to contact him.  For a new patient apt co pay is $20 but if he does not have it , he will just speak with a financial counselor and they have patient asist programs for medications through there pharmacy there as well.  NCM gave patient the brochure for the [facility] and the [facility], which is another free clinic in [place]  NCM may need to assist patient with medications at dc with the Match Letter. NCM will cont to follow for dc needs.  NCM just received call from [facility] and they state they will be able to see patient on Feb 1 at 11 am, this is the earliest they have for a new patient apt. Since this is the earliest , NCM gave patient [facility] clinic brochure, he will have a follow up there on 12/20 at 2 pm., but he will still keep the apt in [place]              Action/Plan:   Expected Discharge Date:                  Expected Discharge Plan:  Home/Self Care  In-House Referral:     Discharge planning Services  CM Consult  Post Acute Care Choice:    Choice offered to:     DME Arranged:    DME Agency:     HH Arranged:    HH Agency:     Status of Service:  In process, will continue to follow  If discussed at Long Length of Stay Meetings, dates discussed:    Additional Comments:  Leone Havenaylor, Tiandra Swoveland Clinton, RN 01/22/2016, 3:40 PM

## 2016-01-23 DIAGNOSIS — D696 Thrombocytopenia, unspecified: Secondary | ICD-10-CM

## 2016-01-23 LAB — CBC
HCT: 41 % (ref 39.0–52.0)
Hemoglobin: 13.6 g/dL (ref 13.0–17.0)
MCH: 33.2 pg (ref 26.0–34.0)
MCHC: 33.2 g/dL (ref 30.0–36.0)
MCV: 100 fL (ref 78.0–100.0)
PLATELETS: 126 10*3/uL — AB (ref 150–400)
RBC: 4.1 MIL/uL — AB (ref 4.22–5.81)
RDW: 11.8 % (ref 11.5–15.5)
WBC: 7.3 10*3/uL (ref 4.0–10.5)

## 2016-01-23 LAB — MAGNESIUM: MAGNESIUM: 2.1 mg/dL (ref 1.7–2.4)

## 2016-01-23 LAB — BASIC METABOLIC PANEL
ANION GAP: 11 (ref 5–15)
BUN: 6 mg/dL (ref 6–20)
CALCIUM: 9.7 mg/dL (ref 8.9–10.3)
CO2: 27 mmol/L (ref 22–32)
CREATININE: 0.69 mg/dL (ref 0.61–1.24)
Chloride: 102 mmol/L (ref 101–111)
Glucose, Bld: 108 mg/dL — ABNORMAL HIGH (ref 65–99)
Potassium: 3.2 mmol/L — ABNORMAL LOW (ref 3.5–5.1)
SODIUM: 140 mmol/L (ref 135–145)

## 2016-01-23 MED ORDER — ORAL CARE MOUTH RINSE
15.0000 mL | Freq: Two times a day (BID) | OROMUCOSAL | Status: DC
  Administered 2016-01-24 – 2016-01-27 (×3): 15 mL via OROMUCOSAL

## 2016-01-23 MED ORDER — POTASSIUM CHLORIDE CRYS ER 20 MEQ PO TBCR
40.0000 meq | EXTENDED_RELEASE_TABLET | ORAL | Status: AC
  Administered 2016-01-23 (×2): 40 meq via ORAL
  Filled 2016-01-23 (×2): qty 2

## 2016-01-23 MED ORDER — HALOPERIDOL LACTATE 5 MG/ML IJ SOLN
10.0000 mg | Freq: Once | INTRAMUSCULAR | Status: AC
Start: 2016-01-23 — End: 2016-01-23
  Administered 2016-01-23: 10 mg via INTRAVENOUS

## 2016-01-23 MED ORDER — DEXTROSE-NACL 5-0.45 % IV SOLN
INTRAVENOUS | Status: DC
  Administered 2016-01-23 – 2016-01-25 (×5): via INTRAVENOUS

## 2016-01-23 MED ORDER — HALOPERIDOL LACTATE 5 MG/ML IJ SOLN
INTRAMUSCULAR | Status: AC
  Filled 2016-01-23: qty 2

## 2016-01-23 MED ORDER — CHLORHEXIDINE GLUCONATE 0.12 % MT SOLN
15.0000 mL | Freq: Two times a day (BID) | OROMUCOSAL | Status: DC
  Administered 2016-01-23 – 2016-01-27 (×7): 15 mL via OROMUCOSAL
  Filled 2016-01-23 (×4): qty 15

## 2016-01-23 MED ORDER — LORAZEPAM 2 MG/ML IJ SOLN
1.0000 mg | INTRAMUSCULAR | Status: DC | PRN
  Administered 2016-01-23 (×3): 2 mg via INTRAVENOUS
  Filled 2016-01-23 (×3): qty 1

## 2016-01-23 MED ORDER — LORAZEPAM 2 MG/ML IJ SOLN
2.0000 mg | INTRAMUSCULAR | Status: DC | PRN
  Administered 2016-01-23: 2 mg via INTRAVENOUS

## 2016-01-23 MED ORDER — DEXMEDETOMIDINE HCL IN NACL 200 MCG/50ML IV SOLN
0.4000 ug/kg/h | INTRAVENOUS | Status: DC
  Administered 2016-01-23: 0.4 ug/kg/h via INTRAVENOUS
  Administered 2016-01-23 – 2016-01-24 (×2): 0.7 ug/kg/h via INTRAVENOUS
  Administered 2016-01-24: 0.9 ug/kg/h via INTRAVENOUS
  Administered 2016-01-24 – 2016-01-25 (×3): 0.5 ug/kg/h via INTRAVENOUS
  Filled 2016-01-23 (×7): qty 50

## 2016-01-23 MED ORDER — LORAZEPAM 2 MG/ML IJ SOLN
1.0000 mg | INTRAMUSCULAR | Status: DC | PRN
  Administered 2016-01-23 (×2): 3 mg via INTRAVENOUS
  Administered 2016-01-23: 2 mg via INTRAVENOUS
  Filled 2016-01-23: qty 2
  Filled 2016-01-23: qty 1
  Filled 2016-01-23: qty 2
  Filled 2016-01-23: qty 1

## 2016-01-23 MED ORDER — LORAZEPAM 2 MG/ML IJ SOLN
2.0000 mg | Freq: Four times a day (QID) | INTRAMUSCULAR | Status: DC
  Administered 2016-01-24 (×2): 2 mg via INTRAVENOUS
  Filled 2016-01-23 (×2): qty 1

## 2016-01-23 MED ORDER — LORAZEPAM 2 MG/ML IJ SOLN
2.0000 mg | INTRAMUSCULAR | Status: DC | PRN
  Administered 2016-01-23 (×2): 2 mg via INTRAVENOUS
  Filled 2016-01-23 (×2): qty 1

## 2016-01-23 MED ORDER — LORAZEPAM 2 MG/ML IJ SOLN: 2.0000 mg | Freq: Four times a day (QID) | INTRAMUSCULAR | Status: DC

## 2016-01-23 NOTE — Consult Note (Signed)
Name: Timothy [name] MRN: [id number] DOB: 06/21/1975    ADMISSION DATE:  01/21/2016 CONSULTATION DATE:  01/23/2016  REFERRING MD :  Dr. Butler Denmarkizwan  CHIEF COMPLAINT:  ETOH withdrawal  HISTORY OF PRESENT ILLNESS:  40 year old male with PMH ETOH abuse and seizure secondary to withdrawal. [facility]12 after suffering a seizure at work. He had reportedly been drinking heavily over the weekend and had stopped during the week due to a flu-like illness. Had seizure at work described as intense, foaming at mouth, flopping like a fish. Heavy use of beer and vodka.  His wife is concerned that there is some underlying psychological issue. Upon arrival to ED he was more awake and was admitted to the hospitalist team. He was treated with CIWA protocol, thiamine, and folate. Of note LFTs elevated on admit. Hospital course complicated 12/14 by worsening withdrawal and DTs which was refractory to escalating doses of Ativan and Haldol. PCCM consulted for potential Precedex infusion.  SIGNIFICANT EVENTS  12/12 admit for ETOH withdrawal seizure 12/14 worsening DTs to ICU for precedex.  STUDIES:  CT head 12/12 > No CT evidence for acute intracranial abnormality. Sinusitis  PAST MEDICAL HISTORY :   has a past medical history of ETOH abuse and Seizure due to alcohol withdrawal, uncomplicated (HCC).  has no past surgical history on file. Prior to Admission medications   Medication Sig Start Date End Date Taking? Authorizing Provider  acetaminophen (TYLENOL) 500 MG tablet Take 1,000 mg by mouth every 6 (six) hours as needed.   Yes Historical Provider, MD  aspirin 325 MG tablet Take 325 mg by mouth every 6 (six) hours as needed.   Yes Historical Provider, MD   No Known Allergies  FAMILY HISTORY:  family history includes Alcoholism in his maternal uncle and paternal uncle; Arthritis in his father. SOCIAL HISTORY:  reports that he has been smoking.  He has been smoking about 0.20 packs  per day. His smokeless tobacco use includes Chew. He reports that he drinks about 27.0 oz of alcohol per week . He reports that he does not use drugs.  REVIEW OF SYSTEMS:   Unable due to encephalopathy  SUBJECTIVE:   VITAL SIGNS: Temp:  [98.5 F (36.9 C)-101.2 F (38.4 C)] 101.2 F (38.4 C) (12/14 2035) Pulse Rate:  [85-151] 151 (12/14 2025) Resp:  [23-39] 39 (12/14 2025) BP: (135-169)/(90-103) 169/90 (12/14 2025) SpO2:  [96 %-100 %] 100 % (12/14 2025)  PHYSICAL EXAMINATION: General:  Male of normal body habitus Neuro:  Agitated trying to pull out of bed against restraints.  HEENT:  [place]/AT, PERRL, no JVD Cardiovascular:  Tachy, regular, no MRG Lungs:  Clear Abdomen:  Soft, non-tender Musculoskeletal:  No acute deformity or ROM limitation Skin:  Grossly intact   Recent Labs Lab 01/21/16 1526 01/22/16 0337 01/23/16 0425  NA 137 139 140  K 3.2* 3.5 3.2*  CL 99* 103 102  CO2 25 25 27   BUN 8 6 6   CREATININE 0.92 0.79 0.69  GLUCOSE 92 83 108*    Recent Labs Lab 01/21/16 1526 01/23/16 0425  HGB 13.0 13.6  HCT 38.6* 41.0  WBC 6.0 7.3  PLT 128* 126*   Ct Head Wo Contrast  Result Date: 01/22/2016 CLINICAL DATA:  Alcohol withdrawal with seizure, altered mental status EXAM: CT HEAD WITHOUT CONTRAST TECHNIQUE: Contiguous axial images were obtained from the base of the skull through the vertex without intravenous contrast. COMPARISON:  None. FINDINGS: Brain: No evidence of acute  infarction, hemorrhage, hydrocephalus, extra-axial collection or mass lesion/mass effect. Vascular: No hyperdense vessel or unexpected calcification. Skull: Normal. Negative for fracture or focal lesion. Sinuses/Orbits: Moderate mucosal thickening in the maxillary sinuses with fluid level on the right. Mucosal thickening in the ethmoid sinuses. No acute orbital abnormality. Other: None IMPRESSION: 1. No CT evidence for acute intracranial abnormality. 2. Sinusitis Electronically Signed   By: Jasmine PangKim   Fujinaga M.D.   On: 01/22/2016 14:57    ASSESSMENT / PLAN:  40 year old male with history of heavy ETOH abuse and DTs, withdrawal seizure in the past. Was drinking heavily and stopped due to flu-like illness, then suffered seizure at work 12/12. Admitted to DTs and withdrawal seizures on CIWA protocol. DTs worsened 12/14 despite 16mg  last 4 hours and 10mg  Haldol last 4 hours.   Alcohol withdrawal with delirium tremens and seizure - Failed CIWA - Transfer to ICU - Initiate Precedex infusion titrated for RASS 0 to -1 - Scheduled ativan 2mg  q 6 hours, PRN for seizure. - Telemetry monitoring and EKG to assess QTC. - Continue thiamine, folate. - IVF with dextrose for maintenance. - Inpatient psych eval needed once out of acute phase.  Hypokalemia - scheduled KDur - Repeat BMP in AM  Tachycardia - sinus - improving with Precedex and agitation resolution - EKG  Best practice - Lovenox for VTE ppx - NPO - Transfer to SDU - Full code  Critical Care time: 35 mins  Joneen RoachPaul Hoffman, AGACNP-BC [facility] Pager [phone] or [phone]  01/23/2016 9:33 PM   STAFF NOTE: Cindi CarbonI, Daniel Feinstein, MD FACP have personally reviewed patient's available data, including medical history, events of note, physical examination and test results as part of my evaluation. I have discussed with resident/NP and other care providers such as pharmacist, RN and RRT. In addition, I personally evaluated patient and elicited key findings of: agitation on SDU, awake, trying to climb out of bed, received high dose ativan and haldol, LUNG clear, had no shacking at that time, CT head mild sinus dz ( unlikely to cause fevers), no further seizures, would AVOID haldol with seizures, move to icu given total doses received, add precedex, and use versed, fent prn, schedule ativan, if re seizures then add keppra, may need eeg - follow clinical status prior to ordering), no clear source URI, follow BP,  HT on precedex, NPO, if able add clonidine, No CIWA scale in icu, LFt in am, repeat K in am , no family The patient is critically ill with multiple organ systems failure and requires high complexity decision making for assessment and support, frequent evaluation and titration of therapies, application of advanced monitoring technologies and extensive interpretation of multiple databases.   Critical Care Time devoted to patient care services described in this note is 55 Minutes. This time reflects time of care of this signee: Rory Percyaniel Feinstein, MD FACP. This critical care time does not reflect procedure time, or teaching time or supervisory time of PA/NP/Med student/Med Resident etc but could involve care discussion time. Rest per NP/medical resident whose note is outlined above and that I agree with   Mcarthur Rossettianiel J. Tyson AliasFeinstein, MD, FACP Pgr: [phone] [facility] 01/24/2016 2:43 AM

## 2016-01-23 NOTE — Progress Notes (Signed)
eLink Physician-Brief Progress Note Patient Name: Timothy [name] DOB: 03/09/1975 MRN: [id number]   Date of Service  01/23/2016  HPI/Events of Note  Severe Delirium - Related to ETOH withdrawal. Has had Ativan and Haldol.  eICU Interventions  Will order: 1. Precedex IV infusion. Titrate to RASS = 0.     Intervention Category Major Interventions: Delirium, psychosis, severe agitation - evaluation and management  Timothy [name] 01/23/2016, 8:33 PM

## 2016-01-23 NOTE — Progress Notes (Signed)
RN called because pt was agitated and having to be placed in 4 point restraints. Pt has had 5mg  Ativan in past about 2 hours per RN. Ativan dose increased. Haldol 10mg  ordered and told RN to call back if still agitated.  RN called again stating the Haldol hasn't helped with symptoms. NP to bedside.  Brief hx: Pt drinks a lot of ETOH everyday. Per history, it appears his last drink was Monday and he had a withdrawal seizure on Tuesday at his work and was brought to ED.   Pt is highly agitated and trying to get OOB pulling on restraints. He is confused. HR 140-160. BP 160-170.  NP called PCCM who will see pt in consult, start precedex, and we will transfer pt to ICU.   KJKG, NP [facility]

## 2016-01-23 NOTE — Progress Notes (Signed)
At start of shift (approx 1905) pt was found exiting bed endorsing he was ready to leave. Pt assisted back to bed and orientation assessed (self only). Pt became increasingly agitated while in bed and was continuously pulling at lines and trying to leave. Per CIWA protocol/score (see flow sheet), Ativan administered with little effect. Pt became verbally aggressive and restraints applied @1920 . Both pt and pt's wife (at bedside) were educated regarding the necessity of restraints for pt's safety and continued care. At approx. 1925 Eula ListenK. Kirby-Graham of [facility] paged regarding pt's worsening state. Page returned at approx. 1940 and orders received for restraints, Haldol, and increased Ativan dosage. Medication administered as instructed with no apparent change in pt condition. Between 1900 and 2130 12 mg of Ativan and 10 mg Haldol administered. Due to worsening condition and fear of recurrent seizure activity,CCM consulted, precedex gtt ordered, and pt transferred to 2M08 @approx  2210. Wife (at bedside) updated on pt condition and need for increased level of care. All questions answered and wife amenable to present plan.

## 2016-01-23 NOTE — Progress Notes (Signed)
Wife took patient's wallet and lunch box home with her.

## 2016-01-23 NOTE — Progress Notes (Addendum)
PROGRESS NOTE    Timothy [name]  BJY:[id number] DOB: 09/25/1975 DOA: 01/21/2016  PCP: Pcp Not In System   HPI:  Timothy [name] is a 40 y.o. male with medical history significant of alcohol dependence and 1 prior alcohol withdrawal seizure presenting with the same.  Patient had a seizure at work today.  Co-workers reported that it was very intense.  Frothing at the mouth, grunting, "stiff as a board but flopping like a fish."  The last thing he remembers was taking a break for lunch and he stood up and his training partner asked a question (he doesn't remember anything after that).  Remembers waking up after the seizure when EMS showed up.  Remembers the ride over but wasn't answering questions correctly at that time.  Denies aura.  Last night at home, had some audio hallucinations.  Also with some visual hallucinations.    Generally drinks 4-6 beers per night Mon-Fri.  Saturdays, 12-15 beers and also on "Sunday.  Recently had flu-like illness - unable to work Thursday-Friday, very sick Saturday-Sunday.  Did not drink ETOH at all Monday, but tried drinking through the weekend.  Slept through the day yesterday, trying to get back to work today.    Has had previous seizure about 6 years ago, just once.  Then, heavy use of vodka.  Was on family vacation and decided to stop drinking.  Was hearing music in the walls and unplugged everything but couldn't get the music to stop.  Was treated at the ER in [place] that time.  Went to rehab at [facility] once.  Stayed 2 nights, and things went very well.  No h/o long-term rehab.  Has never been involved in AA.   Subjective: Hallucinations resolved per family. He has no complaints today.   Assessment & Plan:   Principal Problem:   Alcohol withdrawal seizure     Alcohol dependence - likely withdrawal seizure- he has had them in the past when he was in withdrawal many years ago- head CT reviewed and unrevealing - SDU monitoring. CIWA  Scale with Ativan - social service consulted for outpt assistance programs to quit drinking- he has received information and states he is actively working on finding help -  Depression/ anxiety - he and his family feel that he would benefit from therapy rather than meds- social work consulted to give info about outpt programs    Hypokalemia - replacing  Thrombocytopenia - mild and likely ETOH related- follow intermittently   DVT prophylaxis: Lovenox Code Status: Full code Family Communication: wife and mother Disposition Plan: home when withdrawal resolved Consultants:    Procedures:    Antimicrobials:  Anti-infectives    None       Objective: Vitals:   01/23/16 0031 01/23/16 0448 01/23/16 0745 01/23/16 1338  BP: 138/90 (!) 140/95 (!) 135/103   Pulse: 85 90 (!) 105 86  Resp: (!) 23 (!) 30 (!) 23 (!) 28  Temp: 99.5 F (37.5 C) 98.9 F (37.2 C) 98.5 F (36.9 C) 99.8 F (37.7 C)  TempSrc: Oral Oral Oral Oral  SpO2: 97% 96% 100% 99%  Weight:      Height:        Intake/Output Summary (Last 24 hours) at 01/23/16 1350 Last data filed at 01/23/16 0746  Gross per 24 hour  Intake          2082.92 ml  Output             37" 50 ml  Net         -  1667.08 ml   Filed Weights   01/21/16 1440  Weight: 62.1 kg (137 lb)    Examination: General exam: Appears comfortable  HEENT: PERRLA, oral mucosa moist, no sclera icterus or thrush Respiratory system: Clear to auscultation. Respiratory effort normal. Cardiovascular system: S1 & S2 heard, RRR.  No murmurs  Gastrointestinal system: Abdomen soft, non-tender, nondistended. Normal bowel sound. No organomegaly Central nervous system: Alert and oriented. No focal neurological deficits. Extremities: No cyanosis, clubbing or edema Skin: No rashes or ulcers Psychiatry:  Mood & affect appropriate.     Data Reviewed: I have personally reviewed following labs and imaging studies  CBC:  Recent Labs Lab 01/21/16 1526  01/23/16 0425  WBC 6.0 7.3  HGB 13.0 13.6  HCT 38.6* 41.0  MCV 100.0 100.0  PLT 128* 126*   Basic Metabolic Panel:  Recent Labs Lab 01/21/16 1526 01/22/16 0337 01/23/16 0425 01/23/16 0909  NA 137 139 140  --   K 3.2* 3.5 3.2*  --   CL 99* 103 102  --   CO2 25 25 27   --   GLUCOSE 92 83 108*  --   BUN 8 6 6   --   CREATININE 0.92 0.79 0.69  --   CALCIUM 9.8 9.4 9.7  --   MG  --   --   --  2.1   GFR: Estimated Creatinine Clearance: 110 mL/min (by C-G formula based on SCr of 0.69 mg/dL). Liver Function Tests:  Recent Labs Lab 01/21/16 1526 01/22/16 0337  AST 169* 117*  ALT 99* 84*  ALKPHOS 53 57  BILITOT 1.3* 1.0  PROT 7.4 7.2  ALBUMIN 4.3 4.2   No results for input(s): LIPASE, AMYLASE in the last 168 hours. No results for input(s): AMMONIA in the last 168 hours. Coagulation Profile:  Recent Labs Lab 01/22/16 0337  INR 1.08   Cardiac Enzymes: No results for input(s): CKTOTAL, CKMB, CKMBINDEX, TROPONINI in the last 168 hours. BNP (last 3 results) No results for input(s): PROBNP in the last 8760 hours. HbA1C: No results for input(s): HGBA1C in the last 72 hours. CBG: No results for input(s): GLUCAP in the last 168 hours. Lipid Profile: No results for input(s): CHOL, HDL, LDLCALC, TRIG, CHOLHDL, LDLDIRECT in the last 72 hours. Thyroid Function Tests: No results for input(s): TSH, T4TOTAL, FREET4, T3FREE, THYROIDAB in the last 72 hours. Anemia Panel: No results for input(s): VITAMINB12, FOLATE, FERRITIN, TIBC, IRON, RETICCTPCT in the last 72 hours. Urine analysis: No results found for: COLORURINE, APPEARANCEUR, LABSPEC, PHURINE, GLUCOSEU, HGBUR, BILIRUBINUR, KETONESUR, PROTEINUR, UROBILINOGEN, NITRITE, LEUKOCYTESUR Sepsis Labs: @LABRCNTIP (procalcitonin:4,lacticidven:4) ) Recent Results (from the past 240 hour(s))  MRSA PCR Screening     Status: None   Collection Time: 01/21/16  9:58 PM  Result Value Ref Range Status   MRSA by PCR NEGATIVE NEGATIVE Final     Comment:        The GeneXpert MRSA Assay (FDA approved for NASAL specimens only), is one component of a comprehensive MRSA colonization surveillance program. It is not intended to diagnose MRSA infection nor to guide or monitor treatment for MRSA infections.          Radiology Studies: Dg Chest 2 View  Result Date: 01/21/2016 CLINICAL DATA:  The patient reports 4-5 days pleuritic chest pain radiating to the shoulder blades. Current smoker. Patient has history of seizure activity and a seizure this afternoon. EXAM: CHEST  2 VIEW COMPARISON:  None in PACs FINDINGS: The lungs are well-expanded and clear. There is no pneumothorax,  pneumomediastinum, or pleural effusion. There is no evidence of aspiration. The heart and pulmonary vascularity are normal. The mediastinum is normal in width. The bony thorax exhibits no acute abnormality. IMPRESSION: There is no active cardiopulmonary disease. Electronically Signed   By: David  SwazilandJordan M.D.   On: 01/21/2016 16:35   Ct Head Wo Contrast  Result Date: 01/22/2016 CLINICAL DATA:  Alcohol withdrawal with seizure, altered mental status EXAM: CT HEAD WITHOUT CONTRAST TECHNIQUE: Contiguous axial images were obtained from the base of the skull through the vertex without intravenous contrast. COMPARISON:  None. FINDINGS: Brain: No evidence of acute infarction, hemorrhage, hydrocephalus, extra-axial collection or mass lesion/mass effect. Vascular: No hyperdense vessel or unexpected calcification. Skull: Normal. Negative for fracture or focal lesion. Sinuses/Orbits: Moderate mucosal thickening in the maxillary sinuses with fluid level on the right. Mucosal thickening in the ethmoid sinuses. No acute orbital abnormality. Other: None IMPRESSION: 1. No CT evidence for acute intracranial abnormality. 2. Sinusitis Electronically Signed   By: Jasmine PangKim  Fujinaga M.D.   On: 01/22/2016 14:57      Scheduled Meds: . diclofenac sodium  2 g Topical QID  . enoxaparin  (LOVENOX) injection  40 mg Subcutaneous QHS  . folic acid  1 mg Intravenous Daily  . Influenza vac split quadrivalent PF  0.5 mL Intramuscular Tomorrow-1000  . sodium chloride flush  3 mL Intravenous Q12H  . thiamine  100 mg Intravenous Daily   Continuous Infusions:   LOS: 2 days    Time spent in minutes: 35    Nigil Braman, MD [facility] Pager: [email or web address] Password [email or web address] 01/23/2016, 1:50 PM

## 2016-01-23 NOTE — Significant Event (Signed)
Rapid Response Event Note Called per floor RN at 1945 regarding Pt with intense ETOH withdrawal. RRT unable to attend due to in house emergency, advised RN to notify Primary team and consult PCCM via Elink. [facility] NP paged and at bedside upon my arrival. [facility]ced[facility] gtt [facility] Dr. Arsenio LoaderSommer to be started on arrival to ICU unit.    Overview: Time Called: 2040 Arrival Time: 2045 Event Type: Neurologic  Initial Focused Assessment/ Interventions:  Reaching for IV tubes and lines. Attempting to get OOB, restraints in tact x 4. Pt alert, able to speak in full sentences but confused. Will not follow specific commands, MAEW x 4. No respiratory distress at this time, lungs clear, po2 100% on room air. Skin warm and dry. [facility] NP at bedside discussing plan of care with Pt spouse. P. Hoffman NP PCCM at bedside and Pt assessed. ICU Bed obtained on 2 M however awaiting bed to be cleaned. Precedex gtt started at 2130 while on 3 [place]. Pt monitored per myself at bedside until transfer. Pt initially calm after titrating gtt, on arrival to 63M increased agitation.  Transferred to 63M08 at 2215     Event Summary: Name of Physician Notified: Donnamarie PoagK. Kirby [facility] NP at bedsdie on my arrival at    Name of Consulting Physician Notified: P. Mikey BussingHoffman PCCM NP consulted and at bedside at 2100 at    Outcome: Transferred (Comment)     [name] [name] IvanoffBrooke [name] [name] [name]

## 2016-01-24 DIAGNOSIS — F10931 Alcohol use, unspecified with withdrawal delirium: Secondary | ICD-10-CM

## 2016-01-24 DIAGNOSIS — F10231 Alcohol dependence with withdrawal delirium: Principal | ICD-10-CM

## 2016-01-24 DIAGNOSIS — R000 Tachycardia, unspecified: Secondary | ICD-10-CM

## 2016-01-24 DIAGNOSIS — F10230 Alcohol dependence with withdrawal, uncomplicated: Secondary | ICD-10-CM

## 2016-01-24 LAB — BASIC METABOLIC PANEL
Anion gap: 9 (ref 5–15)
BUN: 6 mg/dL (ref 6–20)
CALCIUM: 9.3 mg/dL (ref 8.9–10.3)
CHLORIDE: 107 mmol/L (ref 101–111)
CO2: 24 mmol/L (ref 22–32)
CREATININE: 0.6 mg/dL — AB (ref 0.61–1.24)
Glucose, Bld: 123 mg/dL — ABNORMAL HIGH (ref 65–99)
Potassium: 3.2 mmol/L — ABNORMAL LOW (ref 3.5–5.1)
SODIUM: 140 mmol/L (ref 135–145)

## 2016-01-24 LAB — MAGNESIUM: MAGNESIUM: 2.1 mg/dL (ref 1.7–2.4)

## 2016-01-24 MED ORDER — LORAZEPAM 2 MG/ML IJ SOLN: 2.0000 mg | INTRAMUSCULAR | Status: DC | PRN

## 2016-01-24 MED ORDER — DICLOFENAC SODIUM 1 % TD GEL
2.0000 g | Freq: Four times a day (QID) | TRANSDERMAL | Status: DC
  Filled 2016-01-24: qty 100

## 2016-01-24 MED ORDER — POTASSIUM CHLORIDE 2 MEQ/ML IV SOLN
30.0000 meq | Freq: Once | INTRAVENOUS | Status: AC
  Administered 2016-01-24: 30 meq via INTRAVENOUS
  Filled 2016-01-24: qty 15

## 2016-01-24 MED ORDER — DICLOFENAC SODIUM 1 % TD GEL
2.0000 g | Freq: Four times a day (QID) | TRANSDERMAL | Status: DC | PRN
  Filled 2016-01-24: qty 100

## 2016-01-24 NOTE — Progress Notes (Signed)
Name: Timothy [name] MRN: [id number] DOB: 09/28/1975    ADMISSION DATE:  01/21/2016 CONSULTATION DATE:  01/23/2016  REFERRING MD :  Dr. Butler Denmarkizwan  CHIEF COMPLAINT:  ETOH withdrawal  Brief:  40 year old male with PMH ETOH abuse and seizure secondary to withdrawal. [facility] after suffering a seizure at work. He had reportedly been drinking heavily over the weekend and had stopped during the week due to a flu-like illness. Had seizure at work described as intense, foaming at mouth, flopping like a fish. Heavy use of beer and vodka.  His wife is concerned that there is some underlying psychological issue. Upon arrival to ED he was more awake and was admitted to the hospitalist team. He was treated with CIWA protocol, thiamine, and folate. Of note LFTs elevated on admit. Hospital course complicated 12/14 by worsening withdrawal and DTs which was refractory to escalating doses of Ativan and Haldol. PCCM consulted for potential Precedex infusion.  SIGNIFICANT EVENTS  12/12 >  admit for ETOH withdrawal seizure 12/14 > worsening DTs to ICU for precedex.  STUDIES:  CT head 12/12 > No CT evidence for acute intracranial abnormality. Sinusitis  SUBJECTIVE:  Rapid Response last night to ICU for reported "intense" withdraw symptoms. This am remains on Precedex gtt.   VITAL SIGNS: Temp:  [97.5 F (36.4 C)-101.2 F (38.4 C)] 97.5 F (36.4 C) (12/15 0350) Pulse Rate:  [51-153] 52 (12/15 1000) Resp:  [13-39] 23 (12/15 1000) BP: (124-169)/(89-106) 149/92 (12/15 1000) SpO2:  [95 %-100 %] 100 % (12/15 1000) Weight:  [72.2 kg (159 lb 2.8 oz)] 72.2 kg (159 lb 2.8 oz) (12/15 0400)  PHYSICAL EXAMINATION: General: Adult male, resting in bed  Neuro:  Sedated, follows commands, moves all extremities  HEENT:  Blair/AT, PERRL, no JVD Cardiovascular:  Brady, no MRG, NI S1/S2 Lungs: unlabored, clear breath sounds bilaterally  Abdomen:  Soft, non-tender, active bowel sounds    Musculoskeletal:  No acute deformity Skin:  Grossly intact   Recent Labs Lab 01/22/16 0337 01/23/16 0425 01/24/16 0258  NA 139 140 140  K 3.5 3.2* 3.2*  CL 103 102 107  CO2 25 27 24   BUN 6 6 6   CREATININE 0.79 0.69 0.60*  GLUCOSE 83 108* 123*    Recent Labs Lab 01/21/16 1526 01/23/16 0425  HGB 13.0 13.6  HCT 38.6* 41.0  WBC 6.0 7.3  PLT 128* 126*   Ct Head Wo Contrast  Result Date: 01/22/2016 CLINICAL DATA:  Alcohol withdrawal with seizure, altered mental status EXAM: CT HEAD WITHOUT CONTRAST TECHNIQUE: Contiguous axial images were obtained from the base of the skull through the vertex without intravenous contrast. COMPARISON:  None. FINDINGS: Brain: No evidence of acute infarction, hemorrhage, hydrocephalus, extra-axial collection or mass lesion/mass effect. Vascular: No hyperdense vessel or unexpected calcification. Skull: Normal. Negative for fracture or focal lesion. Sinuses/Orbits: Moderate mucosal thickening in the maxillary sinuses with fluid level on the right. Mucosal thickening in the ethmoid sinuses. No acute orbital abnormality. Other: None IMPRESSION: 1. No CT evidence for acute intracranial abnormality. 2. Sinusitis Electronically Signed   By: Jasmine PangKim  Fujinaga M.D.   On: 01/22/2016 14:57    ASSESSMENT / PLAN:  40 year old male with history of heavy ETOH abuse and DTs, withdrawal seizure in the past. Was drinking heavily and stopped due to flu-like illness, then suffered seizure at work 12/12. Admitted to DTs and withdrawal seizures on CIWA protocol. DTs worsened 12/14 despite 16mg  last 4 hours and 10mg   Haldol last 4 hours.   ASSESSMENT / PLAN:  PULMONARY A: No issues  P:   Supplemental oxygen to maintain saturation >92   CARDIOVASCULAR A:  Bradycardia secondary to precedex gtt  P:  Cardiac Monitoring  See neuro section   RENAL A:   Hypokalemia  P:   Trend BMP  Replace electrolytes as needed  IVF with dextrose for  maintenance.  GASTROINTESTINAL A:   At Risk  P:   NPO  HEMATOLOGIC A:   No acute  P:  Trend CBC  Continue Lovenox  INFECTIOUS A:   No issues  P:   Trend Fever and WBC curve   ENDOCRINE A:   No issues  P:   Glucose Checks   NEUROLOGIC A:   Alcohol withdrawal with delirium tremens and seizure Seizures  P:   CIWA Wean Precedex infusion titrated for RASS 0 to -1 Scheduled ativan 2mg  q 6 hours, PRN for seizure. Continue thiamine, folate. Inpatient psych eval needed once out of acute phase. RASS goal: 0    FAMILY  - Updates: family updated 12/15 at bedside   - Inter-disciplinary family meet or Palliative Care meeting due by: 12/22   Critical Care time: 32 mins  Jovita KussmaulKatalina Sumiya Mamaril, AG-ACNP [facility]  Pgr: [phone]  PCCM Pgr: [phone]

## 2016-01-24 NOTE — Progress Notes (Addendum)
eLink Physician-Brief Progress Note Patient Name: Timothy [name] DOB: 02/09/1975 MRN: [id number]   Date of Service  01/24/2016  HPI/Events of Note  Chest wall pain.  eICU Interventions  Voltaren Cream Q 6 hours PRN.      Intervention Category Intermediate Interventions: Pain - evaluation and management  Delia Slatten Eugene 01/24/2016, 11:02 PM

## 2016-01-24 NOTE — Progress Notes (Signed)
eLink Physician-Brief Progress Note Patient Name: Timothy [name] DOB: 12/02/1975 MRN: [id number]   Date of Service  01/24/2016  HPI/Events of Note  Request to renew restraint orders.   eICU Interventions  Restraint order renewed.      Intervention Category Minor Interventions: Agitation / anxiety - evaluation and management  Lenell AntuSommer,Steven Eugene 01/24/2016, 9:46 PM

## 2016-01-24 NOTE — Progress Notes (Signed)
Deterding, MD called about K+ of 3.2. Awaiting replacement orders.

## 2016-01-24 NOTE — Care Management Note (Addendum)
Case Management Note  Patient Details  Name: Timothy [name] MRN: [id number] Date of Birth: 05/08/1975  Subjective/Objective:     Pt admitted with ETOH withdrawl               Action/Plan:  PTA from home independent with wife - still working.  Pt is now on precedex drip - no family at bedside.  Pt will need follow up appt set up and possible MATCH letter prior to discharge.  CSW consulted for current substance abuse.  CM will continue to monitor for discharge needs.   Expected Discharge Date:                  Expected Discharge Plan:  Home/Self Care  In-House Referral:  Clinical Social Work  Discharge planning Services  CM Consult  Post Acute Care Choice:    Choice offered to:     DME Arranged:    DME Agency:     HH Arranged:    HH Agency:     Status of Service:  In process, will continue to follow  If discussed at Long Length of Stay Meetings, dates discussed:    Additional Comments: CM [place] at bedside - no family at bedside Timothy ParrClaxton, Timothy Gronewold S, RN 01/24/2016, 2:38 PM

## 2016-01-25 LAB — CBC
HEMATOCRIT: 41.2 % (ref 39.0–52.0)
HEMOGLOBIN: 13.8 g/dL (ref 13.0–17.0)
MCH: 33.3 pg (ref 26.0–34.0)
MCHC: 33.5 g/dL (ref 30.0–36.0)
MCV: 99.5 fL (ref 78.0–100.0)
Platelets: 147 10*3/uL — ABNORMAL LOW (ref 150–400)
RBC: 4.14 MIL/uL — ABNORMAL LOW (ref 4.22–5.81)
RDW: 11.5 % (ref 11.5–15.5)
WBC: 10.2 10*3/uL (ref 4.0–10.5)

## 2016-01-25 LAB — BASIC METABOLIC PANEL
ANION GAP: 11 (ref 5–15)
BUN: 6 mg/dL (ref 6–20)
CO2: 22 mmol/L (ref 22–32)
Calcium: 9.1 mg/dL (ref 8.9–10.3)
Chloride: 104 mmol/L (ref 101–111)
Creatinine, Ser: 0.71 mg/dL (ref 0.61–1.24)
GFR calc Af Amer: >60 mL/min (ref >60)
GFR calc non Af Amer: >60 mL/min (ref >60)
GLUCOSE: 102 mg/dL — AB (ref 65–99)
POTASSIUM: 3.5 mmol/L (ref 3.5–5.1)
Sodium: 137 mmol/L (ref 135–145)

## 2016-01-25 LAB — PHOSPHORUS: PHOSPHORUS: 3.1 mg/dL (ref 2.5–4.6)

## 2016-01-25 LAB — MAGNESIUM: Magnesium: 1.8 mg/dL (ref 1.7–2.4)

## 2016-01-25 MED ORDER — POTASSIUM CHLORIDE CRYS ER 20 MEQ PO TBCR
40.0000 meq | EXTENDED_RELEASE_TABLET | Freq: Once | ORAL | Status: AC
  Administered 2016-01-25: 40 meq via ORAL
  Filled 2016-01-25: qty 2

## 2016-01-25 MED ORDER — LORAZEPAM 1 MG PO TABS
1.0000 mg | ORAL_TABLET | Freq: Two times a day (BID) | ORAL | Status: DC
  Administered 2016-01-25 – 2016-01-27 (×4): 1 mg via ORAL
  Filled 2016-01-25 (×5): qty 1

## 2016-01-25 MED ORDER — MAGNESIUM SULFATE 2 GM/50ML IV SOLN
2.0000 g | Freq: Once | INTRAVENOUS | Status: AC
  Administered 2016-01-25: 2 g via INTRAVENOUS
  Filled 2016-01-25: qty 50

## 2016-01-25 MED ORDER — VITAMIN B-1 100 MG PO TABS
100.0000 mg | ORAL_TABLET | Freq: Every day | ORAL | Status: DC
  Administered 2016-01-25 – 2016-01-27 (×3): 100 mg via ORAL
  Filled 2016-01-25 (×3): qty 1

## 2016-01-25 MED ORDER — FOLIC ACID 1 MG PO TABS
1.0000 mg | ORAL_TABLET | Freq: Every day | ORAL | Status: DC
  Administered 2016-01-25 – 2016-01-27 (×3): 1 mg via ORAL
  Filled 2016-01-25 (×3): qty 1

## 2016-01-25 NOTE — Progress Notes (Signed)
During assessment - pt rounding , pt noted to be sleep . Wife at bedside . Pt arouses , however immediately falls back to sleep . Pt appears to be sedated . Ativan held and chlorhexidine rinse held to prevent aspiration as the pt is not completely alert and oriented at this time . Pt vs stable . Will continue to monitor for signs of withdrawal or adverse events .

## 2016-01-25 NOTE — Progress Notes (Signed)
eLink Physician Progress Note and Electrolyte Replacement  Patient Name: Timothy [name] DOB: 06/26/1975 MRN: [id number]  Date of Service  01/25/2016   HPI/Events of Note    Recent Labs Lab 01/21/16 1526 01/22/16 0337 01/23/16 0425 01/23/16 0909 01/24/16 0258 01/25/16 0140  NA 137 139 140  --  140 137  K 3.2* 3.5 3.2*  --  3.2* 3.5  CL 99* 103 102  --  107 104  CO2 25 25 27   --  24 22  GLUCOSE 92 83 108*  --  123* 102*  BUN 8 6 6   --  6 6  CREATININE 0.92 0.79 0.69  --  0.60* 0.71  CALCIUM 9.8 9.4 9.7  --  9.3 9.1  MG  --   --   --  2.1 2.1 1.8  PHOS  --   --   --   --   --  3.1    Estimated Creatinine Clearance: 121.1 mL/min (by C-G formula based on SCr of 0.71 mg/dL).  Intake/Output      12/15 0701 - 12/16 0700   I.V. (mL/kg) 2295.1 (31.8)   Total Intake(mL/kg) 2295.1 (31.8)   Urine (mL/kg/hr) 1120 (0.6)   Stool 0 (0)   Total Output 1120   Net +1175.1       Urine Occurrence 1 x   Stool Occurrence 1 x    - I/O DETAILED x 24h    Total I/O In: 1078 [I.V.:1078] Out: 575 [Urine:575] - I/O THIS SHIFT    ASSESSMENT Mild low K and mild Low MAg  eICURN Interventions  Replete both   ASSESSMENT: MAJOR ELECTROLYTE      Dr. Kalman ShanMurali Warren Lindahl, M.D., [facility]P Pulmonary and Critical Care Medicine Staff Physician [facility] [place] [facility] Pager: [phone], If no answer or between  15:00h - 7:00h: call [phone]  01/25/2016 5:17 AM

## 2016-01-25 NOTE — Progress Notes (Signed)
Name: Timothy [name] MRN: [id number] DOB: 08/04/1975    ADMISSION DATE:  01/21/2016 CONSULTATION DATE:  01/23/2016  REFERRING MD :  Dr. Butler Denmarkizwan  CHIEF COMPLAINT:  ETOH withdrawal  Brief:  40 year old male with PMH ETOH abuse and seizure secondary to withdrawal. [facility] after suffering a seizure at work. He had reportedly been drinking heavily over the weekend and had stopped during the week due to a flu-like illness. Had seizure at work described as intense, foaming at mouth, flopping like a fish. Heavy use of beer and vodka.  His wife is concerned that there is some underlying psychological issue. Upon arrival to ED he was more awake and was admitted to the hospitalist team. He was treated with CIWA protocol, thiamine, and folate. Of note LFTs elevated on admit. Hospital course complicated 12/14 by worsening withdrawal and DTs which was refractory to escalating doses of Ativan and Haldol. PCCM consulted for potential Precedex infusion.  SIGNIFICANT EVENTS  12/12 >  admit for ETOH withdrawal seizure 12/14 > worsening DTs to ICU for precedex. 12/16- off precedex STUDIES:  CT head 12/12 > No CT evidence for acute intracranial abnormality. Sinusitis  SUBJECTIVE:  In chair watching [employer]  VITAL SIGNS: Temp:  [97.8 F (36.6 C)-100.8 F (38.2 C)] 97.8 F (36.6 C) (12/16 0808) Pulse Rate:  [52-103] 103 (12/16 0800) Resp:  [17-30] 26 (12/16 0800) BP: (120-161)/(81-116) 134/92 (12/16 0800) SpO2:  [96 %-100 %] 99 % (12/16 0800)  PHYSICAL EXAMINATION: General: Adult male, resting in bed Neuro:  Awake, in chair calm, wnl HEENT: jvd wnl Cardiovascular:  no MRG, NI S1/S2 Lungs: CTA Abdomen:  Soft, non-tender, active bowel sounds  Musculoskeletal:  No acute deformity Skin:  Grossly intact   Recent Labs Lab 01/23/16 0425 01/24/16 0258 01/25/16 0140  NA 140 140 137  K 3.2* 3.2* 3.5  CL 102 107 104  CO2 27 24 22   BUN 6 6 6   CREATININE 0.69 0.60*  0.71  GLUCOSE 108* 123* 102*    Recent Labs Lab 01/21/16 1526 01/23/16 0425 01/25/16 0140  HGB 13.0 13.6 13.8  HCT 38.6* 41.0 41.2  WBC 6.0 7.3 10.2  PLT 128* 126* 147*   No results found.  ASSESSMENT / PLAN:  40 year old male with history of heavy ETOH abuse and DTs, withdrawal seizure in the past. Was drinking heavily and stopped due to flu-like illness, then suffered seizure at work 12/12. Admitted to DTs and withdrawal seizures on CIWA protocol. DTs worsened 12/14 despite 16mg  last 4 hours and 10mg  Haldol last 4 hours.   ASSESSMENT / PLAN:  PULMONARY A: No issues  P:   Supplemental oxygen to maintain saturation >92 IS  CARDIOVASCULAR A:  Bradycardia secondary to precedex gtt  P:  Cardiac Monitoring, consider dc with precedex off  RENAL A:   Hypokalemia resolved P:   bmet in am for K  kvo  GASTROINTESTINAL A:   At Risk  P:   NPO Diet start  HEMATOLOGIC A:   No acute  P:  Trend CBC  Continue Lovenox until ambulation  INFECTIOUS A:   No issues  P:   Trend Fever and WBC curve   ENDOCRINE A:   No issues  P:   Glucose Checks   NEUROLOGIC A:   Alcohol withdrawal with delirium tremens and seizure Seizures  P:   CIWA- not scoring much Ativan q12h oral scheduled Scheduled ativan 2mg  q 6 hours done well with this  Continue thiamine, folate. Inpatient psych eval needed once out of acute phase. RASS goal: 0   Back to triad, med  FAMILY  - Updates: family updated 12/15 at bedside   - Inter-disciplinary family meet or Palliative Care meeting due by: 12/22   Timothy Rossettianiel J. Tyson AliasFeinstein, MD, FACP Pgr: [phone] [place] [facility]

## 2016-01-25 NOTE — Progress Notes (Signed)
   RN also reprots patent is calm  Plan Trial off precedex  Dr. Kalman ShanMurali Ellwood Steidle, M.D., [facility] CenterF.C.C.P Pulmonary and Critical Care Medicine Staff Physician [facility] [place] [facility] Pager: [phone], If no answer or between  15:00h - 7:00h: call [phone]  01/25/2016 5:19 AM

## 2016-01-26 ENCOUNTER — Inpatient Hospital Stay (HOSPITAL_COMMUNITY): Payer: Self-pay

## 2016-01-26 DIAGNOSIS — R740 Nonspecific elevation of levels of transaminase and lactic acid dehydrogenase [LDH]: Secondary | ICD-10-CM

## 2016-01-26 DIAGNOSIS — F17210 Nicotine dependence, cigarettes, uncomplicated: Secondary | ICD-10-CM

## 2016-01-26 DIAGNOSIS — Z79899 Other long term (current) drug therapy: Secondary | ICD-10-CM

## 2016-01-26 DIAGNOSIS — F329 Major depressive disorder, single episode, unspecified: Secondary | ICD-10-CM

## 2016-01-26 DIAGNOSIS — Z8261 Family history of arthritis: Secondary | ICD-10-CM

## 2016-01-26 DIAGNOSIS — Z811 Family history of alcohol abuse and dependence: Secondary | ICD-10-CM

## 2016-01-26 LAB — BASIC METABOLIC PANEL
ANION GAP: 8 (ref 5–15)
BUN: 8 mg/dL (ref 6–20)
CALCIUM: 9.4 mg/dL (ref 8.9–10.3)
CO2: 25 mmol/L (ref 22–32)
Chloride: 106 mmol/L (ref 101–111)
Creatinine, Ser: 0.65 mg/dL (ref 0.61–1.24)
Glucose, Bld: 95 mg/dL (ref 65–99)
Potassium: 3.6 mmol/L (ref 3.5–5.1)
Sodium: 139 mmol/L (ref 135–145)

## 2016-01-26 MED ORDER — LORAZEPAM 2 MG/ML IJ SOLN: 1.0000 mg | INTRAMUSCULAR | Status: DC | PRN

## 2016-01-26 MED ORDER — TRAZODONE HCL 50 MG PO TABS
50.0000 mg | ORAL_TABLET | Freq: Every day | ORAL | Status: DC
  Administered 2016-01-26: 50 mg via ORAL
  Filled 2016-01-26: qty 1

## 2016-01-26 NOTE — Progress Notes (Addendum)
[facility] PROGRESS NOTE  Timothy [name] ZOX:[id number] DOB: 05/04/1975 DOA: 01/21/2016  PCP: Pcp Not In System  Brief History/Interval Summary: 40 year old Caucasian male with a past medical history of alcohol abuse and seizures secondary to withdrawal presented after sustaining a seizure at work. Patient was hospitalized. He went through a withdrawal syndrome and had to be moved to the intensive care unit for a Precedex infusion. After stabilization, patient was transferred to the floor.  Reason for Visit: Alcohol withdrawal  Consultants: Critical care medicine  Procedures: None  Antibiotics: None  Subjective/Interval History: Patient mentions cough and some pain in his left chest area when he coughs. No nausea or vomiting. No abdominal pain. Hasn't had any further seizure activity. Patient's wife is at the bedside.  ROS: Denies any shortness of breath  Objective:  Vital Signs  Vitals:   01/25/16 1229 01/25/16 1333 01/25/16 2106 01/26/16 0453  BP:  120/86 (!) 107/54 126/75  Pulse:  (!) 117 90 85  Resp:  17 19 20   Temp:  98.7 F (37.1 C) 98.2 F (36.8 C) 98.2 F (36.8 C)  TempSrc:  Oral    SpO2:  100% 100% 100%  Weight:      Height: 5\' 5"  (1.651 m)       Intake/Output Summary (Last 24 hours) at 01/26/16 1107 Last data filed at 01/25/16 1900  Gross per 24 hour  Intake             1040 ml  Output                0 ml  Net             1040 ml   Filed Weights   01/21/16 1440 01/24/16 0400  Weight: 62.1 kg (137 lb) 72.2 kg (159 lb 2.8 oz)    General appearance: alert, cooperative, appears stated age and no distress Resp: clear to auscultation bilaterally Cardio: regular rate and rhythm, S1, S2 normal, no murmur, click, rub or gallop GI: soft, non-tender; bowel sounds normal; no masses,  no organomegaly Pulses: 2+ and symmetric Neurologic: However, again alert. Oriented 3. No focal neurological deficits. No significant tremors noted.  Lab  Results:  Data Reviewed: I have personally reviewed following labs and imaging studies  CBC:  Recent Labs Lab 01/21/16 1526 01/23/16 0425 01/25/16 0140  WBC 6.0 7.3 10.2  HGB 13.0 13.6 13.8  HCT 38.6* 41.0 41.2  MCV 100.0 100.0 99.5  PLT 128* 126* 147*    Basic Metabolic Panel:  Recent Labs Lab 01/22/16 0337 01/23/16 0425 01/23/16 0909 01/24/16 0258 01/25/16 0140 01/26/16 0454  NA 139 140  --  140 137 139  K 3.5 3.2*  --  3.2* 3.5 3.6  CL 103 102  --  107 104 106  CO2 25 27  --  24 22 25   GLUCOSE 83 108*  --  123* 102* 95  BUN 6 6  --  6 6 8   CREATININE 0.79 0.69  --  0.60* 0.71 0.65  CALCIUM 9.4 9.7  --  9.3 9.1 9.4  MG  --   --  2.1 2.1 1.8  --   PHOS  --   --   --   --  3.1  --     GFR: Estimated Creatinine Clearance: 108.9 mL/min (by C-G formula based on SCr of 0.65 mg/dL).  Liver Function Tests:  Recent Labs Lab 01/21/16 1526 01/22/16 0337  AST 169* 117*  ALT 99* 84*  ALKPHOS  53 57  BILITOT 1.3* 1.0  PROT 7.4 7.2  ALBUMIN 4.3 4.2    Coagulation Profile:  Recent Labs Lab 01/22/16 0337  INR 1.08      Recent Results (from the past 240 hour(s))  MRSA PCR Screening     Status: None   Collection Time: 01/21/16  9:58 PM  Result Value Ref Range Status   MRSA by PCR NEGATIVE NEGATIVE Final    Comment:        The GeneXpert MRSA Assay (FDA approved for NASAL specimens only), is one component of a comprehensive MRSA colonization surveillance program. It is not intended to diagnose MRSA infection nor to guide or monitor treatment for MRSA infections.       Radiology Studies: No results found.   Medications:  Scheduled: . chlorhexidine  15 mL Mouth Rinse BID  . folic acid  1 mg Oral Daily  . LORazepam  1 mg Oral BID  . mouth rinse  15 mL Mouth Rinse q12n4p  . sodium chloride flush  3 mL Intravenous Q12H  . thiamine  100 mg Oral Daily   Continuous: . dexmedetomidine Stopped (01/25/16 0518)  . dextrose 5 % and 0.45% NaCl 50  mL/hr at 01/26/16 0831   ZOX:[id number]:diclofenac sodium, LORazepam, ondansetron **OR** ondansetron (ZOFRAN) IV  Assessment/Plan:  Principal Problem:   Alcohol withdrawal seizure without complication (HCC) Active Problems:   Alcohol dependence (HCC)   Hypokalemia   Delirium tremens (HCC)   Tachycardia    Alcohol withdrawal with delirium tremens and seizure Seizures due to alcohol withdrawal. Patient went through severe withdrawal symptoms during this hospitalization. He had to be transferred to the intensive care unit for Precedex infusion. Now he is stable. Mentions some issues with anxiety as well. Will consult psychiatry to evaluate him. No suicidal or homicidal ideation. Continue thiamine. Ambulate. He has been told that he should not be driving any motor vehicles for at least 6 months. He will need a follow-up with an outpatient provider. Social worker to provide outpatient resources to help with cessation.  Transaminitis Most likely secondary to alcohol abuse. Outpatient monitoring. Check hepatitis panel. Ultrasound hepatobiliary system.  Cough with possible pleuritic chest pain. Lungs examination was unremarkable. He is sitting comfortably. His vital signs are stable. Saturation is normal. We will proceed with chest x-ray for now.  DVT Prophylaxis: Lovenox    Code Status: Full code  Family Communication: Discussed with the patient and his wife  Disposition Plan: Management as above. Mobilize. Await psychiatry input. Discharge anticipated to be tomorrow.    LOS: 5 days   [facility] CampusKRISHNAN,Blinda Turek  [facility] Pager [phone] 01/26/2016, 11:07 AM  If 7PM-7AM, please contact night-coverage at [email or web address], password [email or web address]

## 2016-01-26 NOTE — Consult Note (Signed)
[facility] Consult   Reason for Consult:  Anxiety, depression and Alcohol withdrawal Referring Physician:  Dr. Maryland Pink Patient Identification: Timothy [name] MRN:  [id number] Principal Diagnosis: Alcohol withdrawal seizure without complication [facility]) Diagnosis:   Patient Active Problem List   Diagnosis Date Noted  . Major depressive disorder, single episode [F32.9] 01/26/2016  . Delirium tremens ([place]) [F10.231]   . Tachycardia [R00.0]   . Alcohol dependence ([place]) [F10.20] 01/22/2016  . Hypokalemia [E87.6] 01/22/2016  . Alcohol withdrawal seizure without complication ([place]) [J85.631] 01/21/2016    Total Time spent with patient: 1 hour  Subjective:   Timothy [name] is a 40 y.o. male patient admitted with alcohol withdrawal seizure  HPI: Thanks for asking me to do a psychiatric consult on Timothy [name],  a 40 y.o. male with reports history of depression, Alcohol use disorder dating back to 10 years ago and 1 prior alcohol withdrawal seizure about 5 years ago. Patient reports that he had a seizure episode at work prior to presentation in the hospital few days ago. He reports that he has been drinking at least 12 packs of beer on a daily basis for the past 10 years. He drinks about 15 beers on weekends. He reports only one history of detoxification and Alcohol Rehab at [facility] about 5 years ago. He endorses depression and anxiety due to his inability to stop drinking. Patient reports history of auditory and visual hallucinations due to Alcohol intoxication. He described previous episodes of black out. Patient denies other illicit drug use.Today, he denies suicidal/homicidal thoughts, psychosis or delusions. But he reports ongoing subtle depressive symptoms, poor appetite, poor sleep, anxiety and bilateral fine hand tremors. Patient wants to give up drinking and requesting referral to [place] and psychiatrist who can manage his depression when he is discharged.    Past Psychiatric History: as above  Risk to Self: Is patient at risk for suicide?: No Risk to Others:   Prior Inpatient Therapy:   Prior Outpatient Therapy:    Past Medical History:  Past Medical History:  Diagnosis Date  . ETOH abuse   . Seizure due to alcohol withdrawal, uncomplicated ([place])    4970   History reviewed. No pertinent surgical history. Family History:  Family History  Problem Relation Age of Onset  . Arthritis Father     rheumatoid  . Alcoholism Paternal Uncle   . Alcoholism Maternal Uncle    Family Psychiatric  History: Depression runs on father side Social History:  History  Alcohol Use  . 27.0 oz/week  . 35 Cans of beer per week     History  Drug Use No    Social History   Social History  . Marital status: Married    Spouse name: N/A  . Number of children: N/A  . Years of education: N/A   Occupational History  . call center supervisor    Social History Main Topics  . Smoking status: Current Some Day Smoker    Packs/day: 0.20  . Smokeless tobacco: Current User    Types: Chew  . Alcohol use 27.0 oz/week    45 Cans of beer per week  . Drug use: No  . Sexual activity: Not Asked   Other Topics Concern  . None   Social History Narrative  . None   Additional Social History:    Allergies:  No Known Allergies  Labs:  Results for orders placed or performed during the hospital encounter of 01/21/16 (from the past 48 hour(s))  Basic metabolic panel     Status: Abnormal   Collection Time: 01/25/16  1:40 AM  Result Value Ref Range   Sodium 137 135 - 145 mmol/L   Potassium 3.5 3.5 - 5.1 mmol/L   Chloride 104 101 - 111 mmol/L   CO2 22 22 - 32 mmol/L   Glucose, Bld 102 (H) 65 - 99 mg/dL   BUN 6 6 - 20 mg/dL   Creatinine, Ser 0.71 0.61 - 1.24 mg/dL   Calcium 9.1 8.9 - 10.3 mg/dL   GFR calc non Af Amer >60 >60 mL/min   GFR calc Af Amer >60 >60 mL/min    Comment: (NOTE) The eGFR has been calculated using the CKD EPI equation. This  calculation has not been validated in all clinical situations. eGFR's persistently <60 mL/min signify possible Chronic Kidney Disease.    Anion gap 11 5 - 15  CBC     Status: Abnormal   Collection Time: 01/25/16  1:40 AM  Result Value Ref Range   WBC 10.2 4.0 - 10.5 K/uL   RBC 4.14 (L) 4.22 - 5.81 MIL/uL   Hemoglobin 13.8 13.0 - 17.0 g/dL   HCT 41.2 39.0 - 52.0 %   MCV 99.5 78.0 - 100.0 fL   MCH 33.3 26.0 - 34.0 pg   MCHC 33.5 30.0 - 36.0 g/dL   RDW 11.5 11.5 - 15.5 %   Platelets 147 (L) 150 - 400 K/uL  Magnesium     Status: None   Collection Time: 01/25/16  1:40 AM  Result Value Ref Range   Magnesium 1.8 1.7 - 2.4 mg/dL  Phosphorus     Status: None   Collection Time: 01/25/16  1:40 AM  Result Value Ref Range   Phosphorus 3.1 2.5 - 4.6 mg/dL  Basic metabolic panel     Status: None   Collection Time: 01/26/16  4:54 AM  Result Value Ref Range   Sodium 139 135 - 145 mmol/L   Potassium 3.6 3.5 - 5.1 mmol/L   Chloride 106 101 - 111 mmol/L   CO2 25 22 - 32 mmol/L   Glucose, Bld 95 65 - 99 mg/dL   BUN 8 6 - 20 mg/dL   Creatinine, Ser 0.65 0.61 - 1.24 mg/dL   Calcium 9.4 8.9 - 10.3 mg/dL   GFR calc non Af Amer >60 >60 mL/min   GFR calc Af Amer >60 >60 mL/min    Comment: (NOTE) The eGFR has been calculated using the CKD EPI equation. This calculation has not been validated in all clinical situations. eGFR's persistently <60 mL/min signify possible Chronic Kidney Disease.    Anion gap 8 5 - 15    Current Facility-Administered Medications  Medication Dose Route Frequency Provider Last Rate Last Dose  . chlorhexidine (PERIDEX) 0.12 % solution 15 mL  15 mL Mouth Rinse BID Anders Simmonds, MD   15 mL at 01/26/16 0840  . dexmedetomidine (PRECEDEX) 200 MCG/50ML (4 mcg/mL) infusion  0.4-1.2 mcg/kg/hr Intravenous Titrated Anders Simmonds, MD   Stopped at 01/25/16 0518  . diclofenac sodium (VOLTAREN) 1 % transdermal gel 2 g  2 g Topical Q6H PRN Anders Simmonds, MD      . folic acid  (FOLVITE) tablet 1 mg  1 mg Oral Daily Ihor Austin, RPH   1 mg at 01/26/16 0841  . LORazepam (ATIVAN) injection 1 mg  1 mg Intravenous Q4H PRN Bonnielee Haff, MD      . LORazepam (ATIVAN) tablet 1 mg  1 mg Oral BID Raylene Miyamoto, MD   1 mg at 01/26/16 0841  . MEDLINE mouth rinse  15 mL Mouth Rinse q12n4p Anders Simmonds, MD   15 mL at 01/24/16 1706  . ondansetron (ZOFRAN) tablet 4 mg  4 mg Oral Q6H PRN Karmen Bongo, MD       Or  . ondansetron [facility]) injection 4 mg  4 mg Intravenous Q6H PRN Karmen Bongo, MD      . sodium chloride flush (NS) 0.9 % injection 3 mL  3 mL Intravenous Q12H Karmen Bongo, MD   3 mL at 01/24/16 2124  . thiamine (VITAMIN B-1) tablet 100 mg  100 mg Oral Daily Ihor Austin, RPH   100 mg at 01/26/16 0841  . traZODone (DESYREL) tablet 50 mg  50 mg Oral QHS Corena Pilgrim, MD        Musculoskeletal: Strength & Muscle Tone: within normal limits Gait & Station: normal Patient leans: N/A  Psychiatric Specialty Exam: Physical Exam  Psychiatric: His speech is normal. Judgment and thought content normal. His mood appears anxious. He is withdrawn. Cognition and memory are normal. He exhibits a depressed mood.    Review of Systems  Constitutional: Positive for malaise/fatigue.  HENT: Negative.   Eyes: Negative.   Respiratory: Negative.   Cardiovascular: Negative.   Gastrointestinal: Positive for nausea.  Genitourinary: Negative.   Musculoskeletal: Negative.   Skin: Negative.   Neurological: Positive for tremors.  Endo/Heme/Allergies: Negative.   Psychiatric/Behavioral: Positive for depression and substance abuse. The patient is nervous/anxious and has insomnia.     Blood pressure 126/75, pulse 85, temperature 98.2 F (36.8 C), resp. rate 20, height _0  (1.651 m), weight 72.2 kg (159 lb 2.8 oz), SpO2 100 %.Body mass index is 24.2 kg/m.  General Appearance: Casual  Eye Contact:  Good  Speech:  Clear and Coherent  Volume:  Normal  Mood:  Anxious  and Dysphoric  Affect:  Appropriate  Thought Process:  Coherent and Descriptions of Associations: Intact  Orientation:  Full (Time, Place, and Person)  Thought Content:  Logical  Suicidal Thoughts:  No  Homicidal Thoughts:  No  Memory:  Immediate;   Good Recent;   Good Remote;   Good  Judgement:  Intact  Insight:  Fair  Psychomotor Activity:  Normal and Tremor  Concentration:  Concentration: Fair and Attention Span: Fair  Recall:  Good  Fund of Knowledge:  Good  Language:  Good  Akathisia:  No  Handed:  Right  AIMS (if indicated):     Assets:  Communication Skills Desire for Improvement Housing Resilience Social Support  ADL's:  Intact  Cognition:  WNL  Sleep:   poor     Treatment Plan Summary: Plan: Does not meet criteria for inpatient admission, patient is no longer a danger to self or other. Continue Ativan detox protocol. Start Trazodone 58m daily at bedtime for depression, alcohol withdrawal/Insomnia.  Disposition: No evidence of imminent risk to self or others at present.   Patient does not meet criteria for psychiatric inpatient admission. Supportive therapy provided about ongoing stressors. Unit social worker to assist in [place]  [facility] Patient will need resources for Alcohol Rehabilitation upon discharge  ACorena Pilgrim MD 01/26/2016 2:41 PM

## 2016-01-27 ENCOUNTER — Inpatient Hospital Stay (HOSPITAL_COMMUNITY): Payer: Self-pay

## 2016-01-27 DIAGNOSIS — F10251 Alcohol dependence with alcohol-induced psychotic disorder with hallucinations: Secondary | ICD-10-CM

## 2016-01-27 LAB — COMPREHENSIVE METABOLIC PANEL
ALBUMIN: 3.5 g/dL (ref 3.5–5.0)
ALT: 67 U/L — AB (ref 17–63)
AST: 113 U/L — AB (ref 15–41)
Alkaline Phosphatase: 70 U/L (ref 38–126)
Anion gap: 9 (ref 5–15)
BUN: 8 mg/dL (ref 6–20)
CHLORIDE: 106 mmol/L (ref 101–111)
CO2: 24 mmol/L (ref 22–32)
CREATININE: 0.64 mg/dL (ref 0.61–1.24)
Calcium: 9.4 mg/dL (ref 8.9–10.3)
GFR calc Af Amer: >60 mL/min (ref >60)
GLUCOSE: 97 mg/dL (ref 65–99)
POTASSIUM: 3.4 mmol/L — AB (ref 3.5–5.1)
Sodium: 139 mmol/L (ref 135–145)
Total Bilirubin: 0.9 mg/dL (ref 0.3–1.2)
Total Protein: 6.8 g/dL (ref 6.5–8.1)

## 2016-01-27 MED ORDER — UNABLE TO FIND: 0 refills | Status: DC

## 2016-01-27 MED ORDER — THIAMINE HCL 100 MG PO TABS: 100.0000 mg | ORAL_TABLET | Freq: Every day | ORAL | 0 refills | Status: DC

## 2016-01-27 MED ORDER — THERA VITAL M PO TABS: 1.0000 | ORAL_TABLET | Freq: Every day | ORAL | 0 refills | Status: DC

## 2016-01-27 MED ORDER — TRAZODONE HCL 50 MG PO TABS: 50.0000 mg | ORAL_TABLET | Freq: Every day | ORAL | 0 refills | Status: DC

## 2016-01-27 NOTE — Progress Notes (Addendum)
Update 11:50am: CSW spoke with patient and explained HIPPA regulations. Patient requested that his mother be present for the conversation. CSW offered substance use resources.   CSW placed [facility] in AVS [facility] [place]  CSW signing off.   Osborne Cascoadia Tametra Ahart LCSWA [phone]

## 2016-01-27 NOTE — Progress Notes (Signed)
Patient off unit to US via wheelchair with transport. Patient alert and oriented. Mother at bedside.

## 2016-01-27 NOTE — Care Management Note (Signed)
Case Management Note  Patient Details  Name: Timothy [name] MRN: [id number] Date of Birth: 05/18/1975  Subjective/Objective:   Pt with history of alcohol abuse and seizures secondary to withdrawal presented to the  ED after sustaining a seizure at work.    Action/Plan: Plan to d/c to home today with post hospital [facility], refer to AVS. CWS provide resources regarding ETOH abuse.  Expected Discharge Date:   01/29/2016            Expected Discharge Plan:  Home/Self Care  In-House Referral:  Clinical Social Work  Discharge planning Services  CM Consult, [facility] appt scheduled, [facility]  Status of Service:  Completed, signed off  If discussed at Long Length of Stay Meetings, dates discussed:    Additional Comments:  Epifanio LeschesCole, Jerrit Horen Hudson, RN 01/27/2016, 12:09 PM

## 2016-01-27 NOTE — Discharge Instructions (Signed)
Please follow up with resources provided for treatment of alcohol abuse and depression. Return if worsening symptoms. NO DRIVING FOR 6 MONTHS AND TILL OUTPATIENT PROVIDERS HAVE CLEARED YOU FOR DRIVING.    Alcohol Withdrawal Alcohol withdrawal is a group of symptoms that can develop when a person who drinks heavily and regularly stops drinking or drinks less. What are the causes? Heavy and regular drinking can cause chemicals that send signals from the brain to the body (neurotransmitters) to deactivate. Alcohol withdrawal develops when deactivated neurotransmitters reactivate because a person stops drinking or drinks less. What increases the risk? The more a person drinks and the longer he or she drinks, the greater the risk of alcohol withdrawal. Severe withdrawal is more likely to develop in someone who:  Had severe alcohol withdrawal in the past.  Had a seizure during a previous episode of alcohol withdrawal.  Is elderly.  Is pregnant.  Has been abusing drugs.  Has other medical problems, including:  Infection.  Heart, lung, or liver disease.  Seizures.  Mental health problems. What are the signs or symptoms? Symptoms of this condition can be mild to moderate, or they can be severe. Mild to moderate symptoms may include:  Fatigue.  Nightmares.  Trouble sleeping.  Depression.  Anxiety.  Inability to think clearly.  Mood swings.  Irritability.  Loss of appetite.  Nausea or vomiting.  Clammy skin.  Extreme sweating.  Rapid heartbeat.  Shakiness.  Uncontrollable shaking (tremor). Severe symptoms may include:  Fever.  Seizures.  Severeconfusion.  Feeling or seeing things that are not there (hallucinations). Symptoms usually begin within eight hours after a person stops drinking or drinks less. They can last for weeks. How is this diagnosed? Alcohol withdrawal is diagnosed with a medical history and physical exam. Sometimes, urine and blood  tests are also done. How is this treated? Treatment may involve:  Monitoring blood pressure, pulse, and breathing.  Getting fluids through an IV tube.  Medicine to reduce anxiety.  Medicine to prevent or control seizures.  Multivitamins and B vitamins.  Having a health care provider check on you daily. If symptoms are moderate to severe or if there is a risk of severe withdrawal, treatment may be done at a hospital or treatment center. Follow these instructions at home:  Take medicines and vitamin supplements only as directed by your health care provider.  Do not drink alcohol.  Have someone stay with you or be available if you need help.  Drink enough fluid to keep your urine clear or pale yellow.  Consider joining a 12-step program or another alcohol support group. Contact a health care provider if:  Your symptoms get worse or do not go away.  You cannot keep food or water in your stomach.  You are struggling with not drinking alcohol.  You cannot stop drinking alcohol. Get help right away if:  You have an irregular heartbeat.  You have chest pain.  You have trouble breathing.  You have symptoms of severe withdrawal, such as:  A fever.  Seizures.  Severe confusion.  Hallucinations. This information is not intended to replace advice given to you by your health care provider. Make sure you discuss any questions you have with your health care provider. Document Released: 11/05/2004 Document Revised: 06/05/2015 Document Reviewed: 11/14/2013 Elsevier Interactive Patient Education  2017 [employer]

## 2016-01-27 NOTE — Progress Notes (Signed)
Wardell Heathimothy Andrew Fitzsimons to be D/C'd Home per MD order.  Discussed with the patient and all questions fully answered.  VSS, Skin clean, dry and intact without evidence of skin break down, no evidence of skin tears noted. IV catheter discontinued intact. Site without signs and symptoms of complications. Dressing and pressure applied.  An After Visit Summary was printed and given to the patient. Patient received prescription.  D/c education completed with patient/family including follow up instructions, medication list, d/c activities limitations if indicated, with other d/c instructions as indicated by MD - patient able to verbalize understanding, all questions fully answered.   Patient instructed to return to ED, call 911, or call MD for any changes in condition.   Patient escorted via WC, and D/C home via private auto.  Grayling Congressvan J Chae Shuster 01/27/2016 2:52 PM

## 2016-01-27 NOTE — Discharge Summary (Signed)
[facility]  Physician Discharge Summary   Patient ID: Timothy [name] MRN: [id number] DOB/AGE: 38/05/1975 38 y.o.  Admit date: 01/21/2016 Discharge date: 01/27/2016  PCP: Pcp Not In System  DISCHARGE DIAGNOSES:  Principal Problem:   Alcohol withdrawal seizure without complication (HCC) Active Problems:   Alcohol dependence (HCC)   Hypokalemia   Delirium tremens (HCC)   Tachycardia   Major depressive disorder, single episode   RECOMMENDATIONS FOR OUTPATIENT FOLLOW UP: 1. Outpatient follow-up with primary care as well as psychiatry, as has been arranged. 2. Hepatitis panel is pending 3. Repeat LFTs in 1 week   DISCHARGE CONDITION: fair  Diet recommendation: Regular  Filed Weights   01/21/16 1440 01/24/16 0400  Weight: 62.1 kg (137 lb) 72.2 kg (159 lb 2.8 oz)    INITIAL HISTORY: 40 year old Caucasian male with a past medical history of alcohol abuse and seizures secondary to withdrawal presented after sustaining a seizure at work. Patient was hospitalized. He went through a withdrawal syndrome and had to be moved to the intensive care unit for a Precedex infusion. After stabilization, patient was transferred to the floor.  Consultations:  Critical care medicine  Psychiatry  Procedures:  None  HOSPITAL COURSE:   Alcohol withdrawal with delirium tremens and seizure Seizures due to alcohol withdrawal. Patient went through severe withdrawal symptoms during this hospitalization. He had to be transferred to the intensive care unit for Precedex infusion. Now he is stable. Mentions some issues with anxiety as well. Psychiatry was consulted to evaluate patient. He has been started on trazodone.. Outpatient follow-up. He should continue with thiamine. He is motivated to stop drinking alcohol. Due to his seizures he's been told not to drive for 6 months. Social worker to provide outpatient resources to help with cessation.  Transaminitis Most likely  secondary to alcohol abuse. Hepatitis panel is pending. Ultrasound of the hepatobiliary system showed fatty liver. These were discussed with the patient. Outpatient monitoring.   Cough with possible pleuritic chest pain. Lungs examination was unremarkable. He is sitting comfortably. His vital signs are stable. Saturation is normal. EKG showed normal rhythm without any ischemic changes. Chest x-ray showed tiny pleural effusions. Patient is saturating normal. He feels better this morning. Outpatient follow-up. No clear indication to initiate antibiotics.  Overall, stable, improved. He feels much better this morning. His mother is at the bedside. Okay for discharge home today.    PERTINENT LABS:  The results of significant diagnostics from this hospitalization (including imaging, microbiology, ancillary and laboratory) are listed below for reference.    Microbiology: Recent Results (from the past 240 hour(s))  MRSA PCR Screening     Status: None   Collection Time: 01/21/16  9:58 PM  Result Value Ref Range Status   MRSA by PCR NEGATIVE NEGATIVE Final    Comment:        The GeneXpert MRSA Assay (FDA approved for NASAL specimens only), is one component of a comprehensive MRSA colonization surveillance program. It is not intended to diagnose MRSA infection nor to guide or monitor treatment for MRSA infections.      Labs: Basic Metabolic Panel:  Recent Labs Lab 01/23/16 0425 01/23/16 0909 01/24/16 0258 01/25/16 0140 01/26/16 0454 01/27/16 0303  NA 140  --  140 137 139 139  K 3.2*  --  3.2* 3.5 3.6 3.4*  CL 102  --  107 104 106 106  CO2 27  --  24 22 25 24   GLUCOSE 108*  --  123* 102* 95 97  BUN 6  --  6 6 8 8   CREATININE 0.69  --  0.60* 0.71 0.65 0.64  CALCIUM 9.7  --  9.3 9.1 9.4 9.4  MG  --  2.1 2.1 1.8  --   --   PHOS  --   --   --  3.1  --   --    Liver Function Tests:  Recent Labs Lab 01/21/16 1526 01/22/16 0337 01/27/16 0303  AST 169* 117* 113*  ALT 99*  84* 67*  ALKPHOS 53 57 70  BILITOT 1.3* 1.0 0.9  PROT 7.4 7.2 6.8  ALBUMIN 4.3 4.2 3.5   CBC:  Recent Labs Lab 01/21/16 1526 01/23/16 0425 01/25/16 0140  WBC 6.0 7.3 10.2  HGB 13.0 13.6 13.8  HCT 38.6* 41.0 41.2  MCV 100.0 100.0 99.5  PLT 128* 126* 147*    IMAGING STUDIES Dg Chest 2 View  Result Date: 01/26/2016 CLINICAL DATA:  Cough EXAM: CHEST  2 VIEW COMPARISON:  None. FINDINGS: Tiny pleural effusions, not seen previously. No other acute abnormalities. IMPRESSION: New tiny pleural effusions.  No other acute abnormalities. Electronically Signed   By: Gerome Sam III M.D   On: 01/26/2016 12:05   Dg Chest 2 View  Result Date: 01/21/2016 CLINICAL DATA:  The patient reports 4-5 days pleuritic chest pain radiating to the shoulder blades. Current smoker. Patient has history of seizure activity and a seizure this afternoon. EXAM: CHEST  2 VIEW COMPARISON:  None in PACs FINDINGS: The lungs are well-expanded and clear. There is no pneumothorax, pneumomediastinum, or pleural effusion. There is no evidence of aspiration. The heart and pulmonary vascularity are normal. The mediastinum is normal in width. The bony thorax exhibits no acute abnormality. IMPRESSION: There is no active cardiopulmonary disease. Electronically Signed   By: David  [place] M.D.   On: 01/21/2016 16:35   Ct Head Wo Contrast  Result Date: 01/22/2016 CLINICAL DATA:  Alcohol withdrawal with seizure, altered mental status EXAM: CT HEAD WITHOUT CONTRAST TECHNIQUE: Contiguous axial images were obtained from the base of the skull through the vertex without intravenous contrast. COMPARISON:  None. FINDINGS: Brain: No evidence of acute infarction, hemorrhage, hydrocephalus, extra-axial collection or mass lesion/mass effect. Vascular: No hyperdense vessel or unexpected calcification. Skull: Normal. Negative for fracture or focal lesion. Sinuses/Orbits: Moderate mucosal thickening in the maxillary sinuses with fluid level on  the right. Mucosal thickening in the ethmoid sinuses. No acute orbital abnormality. Other: None IMPRESSION: 1. No CT evidence for acute intracranial abnormality. 2. Sinusitis Electronically Signed   By: Jasmine Pang M.D.   On: 01/22/2016 14:57   US Abdomen Limited Ruq  Result Date: 01/27/2016 CLINICAL DATA:  40 year old male with transaminitis. History of ETOH abuse. EXAM: US ABDOMEN LIMITED - RIGHT UPPER QUADRANT COMPARISON:  None. FINDINGS: Gallbladder: No gallstones or wall thickening visualized. No sonographic Murphy sign noted by sonographer. Common bile duct: Diameter: 4 mm Liver: Diffuse increased hepatic echotexture compatible with fatty infiltration. IMPRESSION: Fatty liver. No gallstones. Electronically Signed   By: Elgie Collard M.D.   On: 01/27/2016 07:14    DISCHARGE EXAMINATION: Vitals:   01/26/16 0453 01/26/16 1451 01/26/16 2121 01/27/16 0511  BP: 126/75 133/90 (!) 144/84 133/82  Pulse: 85 100 91 94  Resp: 20 20 18 18   Temp: 98.2 F (36.8 C) 97.3 F (36.3 C) 98.7 F (37.1 C) 97.6 F (36.4 C)  TempSrc:  Oral Oral Oral  SpO2: 100% 100% 100% 100%  Weight:      Height:  General appearance: alert, cooperative, appears stated age and no distress Resp: clear to auscultation bilaterally Cardio: regular rate and rhythm, S1, S2 normal, no murmur, click, rub or gallop GI: soft, non-tender; bowel sounds normal; no masses,  no organomegaly  DISPOSITION: Home  Discharge Instructions    Call MD for:  difficulty breathing, headache or visual disturbances    Complete by:  As directed    Call MD for:  extreme fatigue    Complete by:  As directed    Call MD for:  persistant dizziness or light-headedness    Complete by:  As directed    Call MD for:  persistant nausea and vomiting    Complete by:  As directed    Call MD for:  severe uncontrolled pain    Complete by:  As directed    Call MD for:  temperature >100.4    Complete by:  As directed    Discharge instructions     Complete by:  As directed    The results of the hepatitis blood test are pending. You should ask your outpatient providers for the resutls. Please stay off of Alcohol. Blood work in 1 week as noted on the prescription. No driving of any kind of motor vehicle for 6 months.  You were cared for by a hospitalist during your hospital stay. If you have any questions about your discharge medications or the care you received while you were in the hospital after you are discharged, you can call the unit and asked to speak with the hospitalist on call if the hospitalist that took care of you is not available. Once you are discharged, your primary care physician will handle any further medical issues. Please note that NO REFILLS for any discharge medications will be authorized once you are discharged, as it is imperative that you return to your primary care physician (or establish a relationship with a primary care physician if you do not have one) for your aftercare needs so that they can reassess your need for medications and monitor your lab values. If you do not have a primary care physician, you can call [phone] for a physician referral.   Increase activity slowly    Complete by:  As directed       ALLERGIES: No Known Allergies   Current Discharge Medication List    START taking these medications   Details  Multiple Vitamins-Minerals (MULTIVITAMIN) tablet Take 1 tablet by mouth daily. Qty: 30 tablet, Refills: 0    thiamine 100 MG tablet Take 1 tablet (100 mg total) by mouth daily. Qty: 30 tablet, Refills: 0    traZODone (DESYREL) 50 MG tablet Take 1 tablet (50 mg total) by mouth at bedtime. Qty: 30 tablet, Refills: 0    UNABLE TO FIND Blood work in 1 week: Complete Metabolic Panel. Dx. Abnormal LFT's. Qty: 1 each, Refills: 0      CONTINUE these medications which have NOT CHANGED   Details  acetaminophen (TYLENOL) 500 MG tablet Take 1,000 mg by mouth every 6 (six) hours as needed.        STOP taking these medications     aspirin 325 MG tablet          Follow-up Information    [facility] Follow up on 03/12/2016.   Specialty:  Internal Medicine Why:  11 am for new patient apt , this is the earliest they have for new patient's, wll see Dr. Koren BoundAdam Schertz Contact information: [address]  [place] [address] [phone]        [facility] Follow up on 01/29/2016.   Why:  2 pm for hospital follow up.   Contact information: [address] [place] [address] [phone]       [facility]. Go to.   Why:  Walk in for first appointment. Please bring Hospital discharge paperwork. Contact information: Address: [address], [place], [place] [address] Hours: Open today  8AM-3PM Phone: [phone]          TOTAL DISCHARGE TIME: 35 mins  [facility]  [facility] Pager [phone]  01/27/2016, 2:31 PM

## 2016-01-28 LAB — HEPATITIS PANEL, ACUTE
HEP A IGM: NEGATIVE
HEP B C IGM: NEGATIVE
HEP B S AG: NEGATIVE

## 2016-01-29 ENCOUNTER — Ambulatory Visit: Payer: Self-pay | Attending: Internal Medicine | Admitting: Physician Assistant

## 2016-01-29 ENCOUNTER — Encounter: Payer: Self-pay | Admitting: Licensed Clinical Social Worker

## 2016-01-29 ENCOUNTER — Ambulatory Visit (HOSPITAL_COMMUNITY)
Admission: RE | Admit: 2016-01-29 | Discharge: 2016-01-29 | Disposition: A | Discharge status: Home / Self Care | Payer: Self-pay | Source: Ambulatory Visit | Attending: Physician Assistant | Admitting: Physician Assistant

## 2016-01-29 VITALS — BP 148/89 | HR 104 | Temp 97.1°F | Resp 16 | Ht 66.0 in | Wt 142.0 lb

## 2016-01-29 DIAGNOSIS — F1021 Alcohol dependence, in remission: Secondary | ICD-10-CM

## 2016-01-29 DIAGNOSIS — I82819 Embolism and thrombosis of superficial veins of unspecified lower extremities: Secondary | ICD-10-CM | POA: Insufficient documentation

## 2016-01-29 DIAGNOSIS — R030 Elevated blood-pressure reading, without diagnosis of hypertension: Secondary | ICD-10-CM | POA: Insufficient documentation

## 2016-01-29 DIAGNOSIS — R569 Unspecified convulsions: Secondary | ICD-10-CM

## 2016-01-29 DIAGNOSIS — F411 Generalized anxiety disorder: Secondary | ICD-10-CM | POA: Insufficient documentation

## 2016-01-29 DIAGNOSIS — R7401 Elevation of levels of liver transaminase levels: Secondary | ICD-10-CM

## 2016-01-29 DIAGNOSIS — F10230 Alcohol dependence with withdrawal, uncomplicated: Secondary | ICD-10-CM | POA: Insufficient documentation

## 2016-01-29 DIAGNOSIS — R740 Nonspecific elevation of levels of transaminase and lactic acid dehydrogenase [LDH]: Secondary | ICD-10-CM

## 2016-01-29 DIAGNOSIS — M7989 Other specified soft tissue disorders: Secondary | ICD-10-CM | POA: Insufficient documentation

## 2016-01-29 DIAGNOSIS — M79601 Pain in right arm: Secondary | ICD-10-CM | POA: Insufficient documentation

## 2016-01-29 MED ORDER — SERTRALINE HCL 50 MG PO TABS: 50.0000 mg | ORAL_TABLET | Freq: Every day | ORAL | 3 refills | Status: DC

## 2016-01-29 MED ORDER — TRAZODONE HCL 50 MG PO TABS: 50.0000 mg | ORAL_TABLET | Freq: Every day | ORAL | 3 refills | Status: DC

## 2016-01-29 NOTE — Progress Notes (Signed)
States trazadone not helping. Has note to be out of work until today.  Wants to discuss work leave extension.

## 2016-01-29 NOTE — Progress Notes (Signed)
Timothy [name]  RUE:[id number]  JYN:[id number]  DOB - 06/22/1975  Chief Complaint  Patient presents with  . Hospitalization Follow-up       Subjective:   Timothy Shiversimothy Coombes is a 40 y.o. male here today for establishment of care. He has a past medical history of alcohol abuse and has had seizure in the past secondary to fall. On 01/21/2016 he had a seizure at work due to the same. EMS was summoned. In the emergency department his LFTs were mildly elevated as well as his bilirubin. A CT of the head did not show any acute process. Ultrasound of his abdomen showed a fatty liver. He was treated with Ativan and Precedex per the withdrawal protocol. He was also seen by psychiatry for anxiety and started on trazodone.  Since discharge he has been abstinent. No chest pain. No shortness of breath. Feels better overall. He has not been back to work. He does state he has quite a bit of anxiety. He is not sure that the trazodone is working. It is affecting his sleep.  He does have pain redness and swelling in the right forearm where he previously had IVs placed.   ROS: GEN: denies fever or chills, denies change in weight Skin: denies lesions or rashes HEENT: denies headache, earache, epistaxis, sore throat, or neck pain LUNGS: denies SHOB, dyspnea, PND, orthopnea CV: denies CP or palpitations ABD: denies abd pain, N or V EXT: denies muscle spasms or swelling; no pain in lower ext, no weakness NEURO: denies numbness or tingling, denies sz, stroke or TIA   ALLERGIES: No Known Allergies  PAST MEDICAL HISTORY: Past Medical History:  Diagnosis Date  . ETOH abuse   . Seizure due to alcohol withdrawal, uncomplicated (HCC)    2004    PAST SURGICAL HISTORY: No past surgical history on file.  MEDICATIONS AT HOME: Prior to Admission medications   Medication Sig Start Date End Date Taking? Authorizing Provider  acetaminophen (TYLENOL) 500 MG tablet Take 1,000 mg by mouth every 6 (six) hours  as needed.   Yes Historical Provider, MD  Multiple Vitamins-Minerals (MULTIVITAMIN) tablet Take 1 tablet by mouth daily. 01/27/16  Yes Osvaldo ShipperGokul Krishnan, MD  thiamine 100 MG tablet Take 1 tablet (100 mg total) by mouth daily. 01/27/16  Yes Osvaldo ShipperGokul Krishnan, MD  traZODone (DESYREL) 50 MG tablet Take 1 tablet (50 mg total) by mouth at bedtime. 01/29/16  Yes Tiffany Netta CedarsS Noel, PA-C  sertraline (ZOLOFT) 50 MG tablet Take 1 tablet (50 mg total) by mouth daily. 01/29/16   Tiffany Netta CedarsS Noel, PA-C  UNABLE TO FIND Blood work in 1 week: Complete Metabolic Panel. Dx. Abnormal LFT's. 01/27/16   Osvaldo ShipperGokul Krishnan, MD     Objective:   Vitals:   01/29/16 1413  BP: (!) 148/89  Pulse: (!) 104  Resp: 16  Temp: 97.1 F (36.2 C)  TempSrc: Oral  SpO2: 100%  Weight: 142 lb (64.4 kg)  Height: 5\' 6"  (1.676 m)    Exam General appearance : Awake, alert, not in any distress. Speech Clear. Not toxic looking HEENT: Atraumatic and Normocephalic, pupils equally reactive to light and accomodation Neck: supple, no JVD. No cervical lymphadenopathy.  Chest:Good air entry bilaterally, no added sounds  CVS: S1 S2 regular, no murmurs.  Abdomen: Bowel sounds present, Non tender and not distended with no gaurding, rigidity or rebound. Extremities: RUE swollen, red and hot anterior forearm Neurology: Awake alert, and oriented X 3, CN II-XII intact, Non focal Skin:No Rash Wounds:N/A   Assessment &  Plan  1. Alcohol dependence with SZ from Withdrawal  -Cont Thiamine  -Cont abstinence  -Community resources provided by SW  -LFTs in 1 week  -advised to not drive for 6 months  2. Elevated BP  -re check next visit  -low salt  -abstinence from ETOH  3. GAD  -Add Zoloft  -Cont Trazadone  4. RUE swelling/pain/redness r/o DVT  -check RUE U/S  -warm compresses  Out of work until 02/04/16 Return in about 1 week (around 02/05/2016).  The patient was given clear instructions to go to ER or return to medical center if symptoms  don't improve, worsen or new problems develop. The patient verbalized understanding. The patient was told to call to get lab results if they haven't heard anything in the next week.   This note has been created with Education officer, environmentalDragon speech recognition software and smart phrase technology. Any transcriptional errors are unintentional.    Scot Juniffany Noel, PA-C [facility] [place], [place] [phone]   01/29/2016, 2:40 PM

## 2016-01-29 NOTE — Addendum Note (Signed)
Addended by: Particia LatherPOLLOCK, JAY'A R on: 01/29/2016 03:23 PM   Modules accepted: Orders

## 2016-01-29 NOTE — Progress Notes (Signed)
**  Preliminary report by tech**  Right upper extremity venous duplex complete. There is evidence of superficial vein thrombosis involving the cephalic vein in the forearm of the right upper extremity. There is no evidence of deep vein thrombosis involving the right upper extremity. Results were given to Scot Juniffany Noel.  01/29/16 5:02 PM Olen CordialGreg Ohana Birdwell RVT

## 2016-01-30 NOTE — BH Specialist Note (Signed)
Session Start time: 2:40   End Time: 3:05 pm Total Time:  25 minutes Type of Service: Behavioral Health - Individual/Family Interpreter: No.   Interpreter Name & Language: N/A # [facility]   SUBJECTIVE: Timothy [name] is a 40 y.o. male  Pt. was referred by Zenda AlpersPA-C Noel for:  anxiety and depression. Pt. reports the following symptoms/concerns: difficulty staying asleep, racing thoughts, overwhelming feelings of sadness, and substance use Duration of problem:  Pt used substances (alcohol) for 15 years. Recently discharged from detox Severity: severe Previous treatment: None reported   OBJECTIVE: Mood: Anxious & Affect: Appropriate Risk of harm to self or others: Pt denied SI/HI Assessments administered: PHQ-9; GAD-7  LIFE CONTEXT:  Family & Social: Pt resides with his wife. He receives additional emotional support from his parents School/ Work: Pt is employed full time as a Merchandiser, retailsupervisor for a call center. His wife is employed, as well Self-Care: Pt reports difficulty staying asleep. He has used substances (alcohol) for over fifteen years and recently completed detox Life changes: Pt has been sober from alcohol for two days and is experiencing increased anxiety. What is important to pt/family (values): Family   GOALS ADDRESSED:  Decrease symptoms of depression Decrease symptoms of anxiety   INTERVENTIONS: Solution Focused, Strength-based, Supportive and Meditation: Mindfulness   ASSESSMENT:  Pt currently experiencing depression and anxiety triggered by recent detox from alchol. Pt reports difficulty staying asleep, racing thoughts, overwhelming feelings of sadness, and substance use history. Pt receives strong support from family and was accompanied by mother during visit. Pt may benefit from psycho education, psychotherapy, and medication management. LCSWA educated pt on the cycle of depression and anxiety, how substance use can negatively impact  physical/mental health, and the importance of implementing healthy coping strategies to decrease symptoms. Pt identified a healthy coping skill (mindfulness) to utilize on a daily basis. He is not interested in initiating therapy or participating in AA at this time; however, was open to community resources. LCSWA provided pt with community resources on support groups and treatment centers.       PLAN: 1. F/U with behavioral health clinician: Pt was encouraged to contact LCSWA if symptoms worsen or fail to improve to [facility]. 2. Behavioral Health meds: Zoloft and Desyrel 3. Behavioral recommendations: LCSWA recommends that pt apply healthy coping skills discussed and utilize community resources to assist in sobriety. Pt is encouraged to schedule follow up appointment with LCSWA 4. Referral: Brief Counseling/Psychotherapy, [facility], Problem-solving teaching/coping strategies, Psychoeducation and Supportive Counseling 5. From scale of 1-10, how likely are you to follow plan: 8/10   Timothy [name], MSW, LCSWA  Clinical Social Worker 01/30/16 10:28 am  Warmhandoff:   Warm Hand Off Completed.

## 2016-02-14 ENCOUNTER — Encounter: Payer: Self-pay | Admitting: Family Medicine

## 2016-02-14 ENCOUNTER — Ambulatory Visit: Payer: Self-pay | Attending: Family Medicine | Admitting: Family Medicine

## 2016-02-14 VITALS — BP 105/71 | HR 94 | Temp 98.8°F | Resp 18 | Ht 66.0 in | Wt 143.4 lb

## 2016-02-14 DIAGNOSIS — R251 Tremor, unspecified: Secondary | ICD-10-CM | POA: Insufficient documentation

## 2016-02-14 DIAGNOSIS — Z5189 Encounter for other specified aftercare: Secondary | ICD-10-CM | POA: Insufficient documentation

## 2016-02-14 DIAGNOSIS — Z23 Encounter for immunization: Secondary | ICD-10-CM

## 2016-02-14 DIAGNOSIS — F1021 Alcohol dependence, in remission: Secondary | ICD-10-CM

## 2016-02-14 DIAGNOSIS — Z79899 Other long term (current) drug therapy: Secondary | ICD-10-CM | POA: Insufficient documentation

## 2016-02-14 DIAGNOSIS — R0981 Nasal congestion: Secondary | ICD-10-CM | POA: Insufficient documentation

## 2016-02-14 DIAGNOSIS — R0982 Postnasal drip: Secondary | ICD-10-CM

## 2016-02-14 DIAGNOSIS — F1020 Alcohol dependence, uncomplicated: Secondary | ICD-10-CM | POA: Insufficient documentation

## 2016-02-14 MED ORDER — LORATADINE 10 MG PO TABS: 10.0000 mg | ORAL_TABLET | Freq: Every day | ORAL | 2 refills | Status: DC

## 2016-02-14 MED ORDER — OXYMETAZOLINE HCL 0.05 % NA SOLN: 2.0000 | Freq: Two times a day (BID) | NASAL | 0 refills | Status: AC

## 2016-02-14 NOTE — Progress Notes (Signed)
Patient is here for F/UP Seizure  Patient have no pain but just a sinus headache   Patient BP 105/71 P 94

## 2016-02-14 NOTE — Patient Instructions (Addendum)
Tremor A tremor is trembling or shaking that you cannot control. Most tremors affect the hands or arms. Tremors can also affect the head, vocal cords, face, and other parts of the body. There are many types of tremors. Common types include:  Essential tremor. These usually occur in people over the age of 40. It may run in families and can happen in otherwise healthy people.  Resting tremor. These occur when the muscles are at rest, such as when your hands are resting in your lap. People with Parkinson disease often have resting tremors.  Postural tremor. These occur when you try to hold a pose, such as keeping your hands outstretched.  Kinetic tremor. These occur during purposeful movement, such as trying to touch a finger to your nose.  Task-specific tremor. These may occur when you perform tasks such as handwriting, speaking, or standing.  Psychogenic tremor. These dramatically lessen or disappear when you are distracted. They can happen in people of all ages. Some types of tremors have no known cause. Tremors can also be a symptom of nervous system problems (neurological disorders) that may occur with aging. Some tremors go away with treatment while others do not. Follow these instructions at home: Watch your tremor for any changes. The following actions may help to lessen any discomfort you are feeling:  Take medicines only as directed by your health care provider.  Limit alcohol intake to no more than 1 drink per day for nonpregnant women and 2 drinks per day for men. One drink equals 12 oz of beer, 5 oz of wine, or 1 oz of hard liquor.  Do not use any tobacco products, including cigarettes, chewing tobacco, or electronic cigarettes. If you need help quitting, ask your health care provider.  Avoid extreme heat or cold.  Limit the amount of caffeine you consumeas directed by your health care provider.  Try to get 8 hours of sleep each night.  Find ways to manage your stress, such  as meditation or yoga.  Keep all follow-up visits as directed by your health care provider. This is important. Contact a health care provider if:  You start having a tremor after starting a new medicine.  You have tremor with other symptoms such as:  Numbness.  Tingling.  Pain.  Weakness.  Your tremor gets worse.  Your tremor interferes with your day-to-day life. This information is not intended to replace advice given to you by your health care provider. Make sure you discuss any questions you have with your health care provider. Document Released: 01/16/2002 Document Revised: 09/29/2015 Document Reviewed: 07/24/2013 Elsevier Interactive Patient Education  2017 [employer]  

## 2016-02-14 NOTE — Progress Notes (Signed)
Subjective:  Patient ID: Timothy [name], male    DOB: 09/28/1975  Age: 41 y.o. MRN: [id number]  CC: Follow-up  HPI Timothy [name] presents to follow up for alcohol withdrawal with seizure. He also has c/o cough. Denies any seizures, headaches, or vision changes since being discharged from the hospital.Denies any abdominal pain, swelling, or symptoms of jaundice.  Denies any SI/HI. Reports wife observed him sleeping at night having tremors while sleeping about every 10 seconds. He reports symptoms of cough, nasal drainage, and mild frontal sinus pressure that began Wednesday. Cough is productive with clear sputum and clear nasal drainage. Denies fevers.   Outpatient Medications Prior to Visit  Medication Sig Dispense Refill  . Multiple Vitamins-Minerals (MULTIVITAMIN) tablet Take 1 tablet by mouth daily. 30 tablet 0  . sertraline (ZOLOFT) 50 MG tablet Take 1 tablet (50 mg total) by mouth daily. 30 tablet 3  . traZODone (DESYREL) 50 MG tablet Take 1 tablet (50 mg total) by mouth at bedtime. 30 tablet 3  . acetaminophen (TYLENOL) 500 MG tablet Take 1,000 mg by mouth every 6 (six) hours as needed.    . thiamine 100 MG tablet Take 1 tablet (100 mg total) by mouth daily. (Patient not taking: Reported on 02/14/2016) 30 tablet 0  . UNABLE TO FIND Blood work in 1 week: Complete Metabolic Panel. Dx. Abnormal LFT's. 1 each 0   No facility-administered medications prior to visit.     ROS Review of Systems  HENT: Positive for postnasal drip and sore throat. Negative for trouble swallowing.   Eyes: Negative.   Respiratory: Negative.   Cardiovascular: Negative.   Gastrointestinal: Negative.   Neurological: Positive for tremors (pt. reports wife notices he has frequent tremors while sleeping).   Objective:  BP 105/71 (BP Location: Right Arm, Patient Position: Sitting, Cuff Size: Normal)   Pulse 94   Temp 98.8 F (37.1 C) (Oral)   Resp 18   Ht 5\' 6"  (1.676 m)   Wt 143 lb 6.4 oz (65  kg)   SpO2 98%   BMI 23.15 kg/m   BP/Weight 02/14/2016 01/29/2016 01/27/2016  Systolic BP 105 148 133  Diastolic BP 71 89 82  Wt. (Lbs) 143.4 142 -  BMI 23.15 22.92 -   Physical Exam  Constitutional: He is oriented to person, place, and time.  HENT:  Head: Normocephalic.  Right Ear: External ear normal.  Left Ear: External ear normal.  Nose: Nose normal.  Mouth/Throat: Oropharynx is clear and moist. No oropharyngeal exudate.  Eyes: Pupils are equal, round, and reactive to light.  Neck: Normal range of motion.  Cardiovascular: Normal rate, regular rhythm, normal heart sounds and intact distal pulses.   Pulmonary/Chest: Effort normal and breath sounds normal.  Abdominal: Soft. Bowel sounds are normal.  Neurological: He is alert and oriented to person, place, and time. He displays normal reflexes. He displays a negative Romberg sign. Gait (tandem gait abnormal; difficulty keeping balance noted.) abnormal.  Psychiatric: He has a normal mood and affect. His behavior is normal. Thought content normal.   Assessment & Plan:   Problem List Items Addressed This Visit      Other   Alcohol dependence (HCC) - Primary (Chronic)   Relevant Orders   Hepatic Function Panel (Completed)    Other Visit Diagnoses    Tremor of unknown origin       Relevant Orders   Ambulatory referral to Neurology       -Further evaluation needed, possible EEG to rule  out nocturnal seizures.   Nasal congestion       Relevant Medications   oxymetazoline (AFRIN 12 HOUR) 0.05 % nasal spray   Post-nasal drip       Relevant Medications   loratadine (CLARITIN) 10 MG tablet   Needs flu shot       Relevant Orders   Flu Vaccine QUAD 36+ mos PF IM (Fluarix & Fluzone Quad PF) (Completed)     Meds ordered this encounter  Medications  . loratadine (CLARITIN) 10 MG tablet    Sig: Take 1 tablet (10 mg total) by mouth daily.    Dispense:  30 tablet    Refill:  2    Order Specific Question:   Supervising Provider     Answer:   Quentin Angst [id number]  . oxymetazoline (AFRIN 12 HOUR) 0.05 % nasal spray    Sig: Place 2 sprays into both nostrils 2 (two) times daily.    Dispense:  30 mL    Refill:  0    Order Specific Question:   Supervising Provider    Answer:   Quentin Angst [id number]    Follow-up: Return if symptoms worsen or fail to improve.   Lizbeth Bark FNP

## 2016-02-15 LAB — HEPATIC FUNCTION PANEL
ALBUMIN: 4.3 g/dL (ref 3.6–5.1)
ALK PHOS: 125 U/L — AB (ref 40–115)
ALT: 75 U/L — AB (ref 9–46)
AST: 75 U/L — ABNORMAL HIGH (ref 10–40)
BILIRUBIN TOTAL: 0.5 mg/dL (ref 0.2–1.2)
Bilirubin, Direct: 0.2 mg/dL (ref <=0.2)
Indirect Bilirubin: 0.3 mg/dL (ref 0.2–1.2)
Total Protein: 7.1 g/dL (ref 6.1–8.1)

## 2016-03-05 ENCOUNTER — Telehealth: Payer: Self-pay

## 2016-03-05 NOTE — Telephone Encounter (Signed)
-----   Message from Lizbeth BarkMandesia R Hairston, FNP sent at 02/28/2016 12:35 PM EST ----- -Liver function test show stable elevations. This can be associated with alcoholic or fatty liver disease.  -Abstain from alcohol use. Do not use tylenol or products containing acetaminophen.  -If you begin to develop yellowing of the skin or eyes; or chalky-clay colored stools schedule an appointment.

## 2016-03-05 NOTE — Telephone Encounter (Signed)
CMA call to go over lab results, no answer, but left a VM stating his results, and if he has any question to feel free to call me back

## 2016-03-12 NOTE — Progress Notes (Signed)
Timothy [name] was seen today in neurologic consultation at the request of Arrie SenateMandesia Hairston, FNP.  The consultation is for the evaluation of tremor and EtOH w/d seizure.  The records that were made available to me were reviewed.  This patient is accompanied in the office by his spouse who supplements the history.  The patient presented to the hospital on December 12 after having a seizure at work.  The patient remembers is walking to the printer and stood up and talk to his coworker and as a last thing that he remembers.  The next thing he remembers is waking up with the EMS.  He had no warning prior to the seizure.  He was told that he was stiff (didn't shake per pt). The patient generally had been drinking 4-6 beers per night throughout the week and 12-15 beers on the weekends, including Sunday.  On this particular weekend, he had had a GI illness and had been sick and had not been able to drink at all on Monday.  The event at work occurred on Tuesday, December 12.  He does report a history of similar, approximately 6 years ago.  He was on a family vacation and had decided to discontinue alcohol cold [place].  Patient reports that he has not had any alcohol since. Reports that he d/c cigarettes since.  Wife states that he has "body spasms" in the sleep since d/c from the hospital.  It will start about 30-45 min into sleep and may last every 10 seconds and she describes it as a jerking.  May be entire body or sometimes feet and sometimes hand.  He will sometimes awaken himself up.  He states that he will note it sometimes right before falling into a deep sleep.  He had it previously but not to this degree.  Reports only new meds are sertraline and trazodone.    ALLERGIES:  No Known Allergies  CURRENT MEDICATIONS:  Outpatient Encounter Prescriptions as of 03/16/2016  Medication Sig  . acetaminophen (TYLENOL) 500 MG tablet Take 1,000 mg by mouth every 6 (six) hours as needed.  . loratadine  (CLARITIN) 10 MG tablet Take 1 tablet (10 mg total) by mouth daily.  . Multiple Vitamins-Minerals (MULTIVITAMIN) tablet Take 1 tablet by mouth daily.  . sertraline (ZOLOFT) 50 MG tablet Take 1 tablet (50 mg total) by mouth daily.  Marland Kitchen. thiamine 100 MG tablet Take 1 tablet (100 mg total) by mouth daily.  . traZODone (DESYREL) 50 MG tablet Take 1 tablet (50 mg total) by mouth at bedtime.  . [DISCONTINUED] UNABLE TO FIND Blood work in 1 week: Complete Metabolic Panel. Dx. Abnormal LFT's.   No facility-administered encounter medications on file as of 03/16/2016.     PAST MEDICAL HISTORY:   Past Medical History:  Diagnosis Date  . ETOH abuse   . Seizure due to alcohol withdrawal, uncomplicated (HCC)    2004    PAST SURGICAL HISTORY:   Past Surgical History:  Procedure Laterality Date  . NO PAST SURGERIES      SOCIAL HISTORY:   Social History   Social History  . Marital status: Married    Spouse name: N/A  . Number of children: N/A  . Years of education: N/A   Occupational History  . call center supervisor    Social History Main Topics  . Smoking status: Former Smoker    Quit date: 01/10/2016  . Smokeless tobacco: Former NeurosurgeonUser    Types: Chew  . Alcohol use  No  . Drug use: No  . Sexual activity: Not on file   Other Topics Concern  . Not on file   Social History Narrative  . No narrative on file    FAMILY HISTORY:   Family Status  Relation Status  . Mother Alive  . Father Alive  . Paternal Uncle   . Maternal Uncle     ROS:  A complete 10 system review of systems was obtained and was unremarkable apart from what is mentioned above.  PHYSICAL EXAMINATION:    VITALS:   Vitals:   03/16/16 1242  BP: 110/68  Pulse: (!) 110  Weight: 151 lb (68.5 kg)  Height: 5\' 6"  (1.676 m)    GEN:  Normal appears male in no acute distress.  Appears stated age. HEENT:  Normocephalic, atraumatic. The mucous membranes are moist. The superficial temporal arteries are without ropiness  or tenderness. Cardiovascular: Tachycardic.  Regular.   Lungs: Clear to auscultation bilaterally. Neck/Heme: There are no carotid bruits noted bilaterally.  NEUROLOGICAL: Orientation:  The patient is alert and oriented x 3.  Fund of knowledge is appropriate.  Recent and remote memory intact.  Attention span and concentration normal.  Repeats and names without difficulty. Cranial nerves: There is good facial symmetry. The pupils are equal round and reactive to light bilaterally. Fundoscopic exam reveals clear disc margins bilaterally. Extraocular muscles are intact and visual fields are full to confrontational testing. Speech is fluent and clear. Soft palate rises symmetrically and there is no tongue deviation. Hearing is intact to conversational tone. Tone: Tone is good throughout. Sensation: Sensation is intact to light touch and pinprick throughout (facial, trunk, extremities). Vibration is intact at the bilateral big toe. There is no extinction with double simultaneous stimulation. There is no sensory dermatomal level identified. Coordination:  The patient has no difficulty with RAM's or FNF bilaterally. Motor: Strength is 5/5 in the bilateral upper and lower extremities.  Shoulder shrug is equal and symmetric. There is no pronator drift.  There are no fasciculations noted. DTR's: Deep tendon reflexes are 2/4 at the bilateral biceps, triceps, brachioradialis, patella and achilles.  Plantar responses are downgoing bilaterally. Gait and Station: The patient is able to ambulate without difficulty. The patient is able to heel toe walk without any difficulty. The patient is able to ambulate in a tandem fashion. The patient is able to stand in the Romberg position. Abnormal movements:  Minimal postural tremor.  No asterixis.  No myoclonus noted.  Labs:    Chemistry      Component Value Date/Time   NA 139 01/27/2016 0303   K 3.4 (L) 01/27/2016 0303   CL 106 01/27/2016 0303   CO2 24 01/27/2016  0303   BUN 8 01/27/2016 0303   CREATININE 0.64 01/27/2016 0303      Component Value Date/Time   CALCIUM 9.4 01/27/2016 0303   ALKPHOS 125 (H) 02/14/2016 1651   AST 75 (H) 02/14/2016 1651   ALT 75 (H) 02/14/2016 1651   BILITOT 0.5 02/14/2016 1651     No results found for: TSH  No results found for: VITAMINB12  Lab Results  Component Value Date   WBC 10.2 01/25/2016   HGB 13.8 01/25/2016   HCT 41.2 01/25/2016   MCV 99.5 01/25/2016   PLT 147 (L) 01/25/2016     IMPRESSION/PLAN  1.  Alcohol withdrawal seizure  -This is the second lifetime alcohol withdrawal seizure that the patient has had due to acute alcohol withdrawal.  Talked about the  importance of complete abstinence from alcohol.  Talked about the fact that this does not increase risk of epilepsy.  Nonetheless, I asked him to hold on driving.  Talked about seizure and safety.  2.  Probable myoclonus  -sounds like myoclonus descriptively, but am unsure of why this would only occur at night.  Certainly, benign hypnic myoclonus can occur only at night, but generally not in 10 second intervals.  Myoclonus can happen after alcohol withdrawal, but this would not be just confined to night.  I do think that this warrants an EEG.  I think his insurance will require a routine EEG before he has an ambulatory EEG, which is really what I need.  -We'll recheck his liver enzymes, although they may have not normalized yet.  We will check his TSH and B12.  He has no asterixis, so decided to hold off on ammonia.  3.  We will call him with the results of the above and depending on the results and on clinical course, he may or may not need further follow-up.  Much greater than 50% of this visit was spent in counseling and coordinating care.  Total face to face time:  45 min    Cc:  Arrie Senate, FNP

## 2016-03-16 ENCOUNTER — Other Ambulatory Visit (INDEPENDENT_AMBULATORY_CARE_PROVIDER_SITE_OTHER): Payer: Self-pay

## 2016-03-16 ENCOUNTER — Ambulatory Visit (INDEPENDENT_AMBULATORY_CARE_PROVIDER_SITE_OTHER): Payer: Self-pay | Admitting: Neurology

## 2016-03-16 ENCOUNTER — Encounter: Payer: Self-pay | Admitting: Neurology

## 2016-03-16 VITALS — BP 110/68 | HR 110 | Ht 66.0 in | Wt 151.0 lb

## 2016-03-16 DIAGNOSIS — G40909 Epilepsy, unspecified, not intractable, without status epilepticus: Secondary | ICD-10-CM

## 2016-03-16 DIAGNOSIS — G253 Myoclonus: Secondary | ICD-10-CM

## 2016-03-16 DIAGNOSIS — D696 Thrombocytopenia, unspecified: Secondary | ICD-10-CM

## 2016-03-16 DIAGNOSIS — R251 Tremor, unspecified: Secondary | ICD-10-CM

## 2016-03-16 LAB — HEPATIC FUNCTION PANEL
ALK PHOS: 82 U/L (ref 39–117)
ALT: 37 U/L (ref 0–53)
AST: 37 U/L (ref 0–37)
Albumin: 4.4 g/dL (ref 3.5–5.2)
BILIRUBIN DIRECT: 0.1 mg/dL (ref 0.0–0.3)
BILIRUBIN TOTAL: 0.5 mg/dL (ref 0.2–1.2)
Total Protein: 7.4 g/dL (ref 6.0–8.3)

## 2016-03-16 LAB — VITAMIN B12: VITAMIN B 12: 457 pg/mL (ref 211–911)

## 2016-03-16 LAB — TSH: TSH: 1.16 u[IU]/mL (ref 0.35–4.50)

## 2016-03-16 NOTE — Patient Instructions (Signed)
1. Your provider has requested that you have labwork completed today. Please go to [facility] ([id number]) on the second floor of this building before leaving the office today. You do not need to check in. If you are not called within 15 minutes please check with the front desk.   2. We will schedule an EEG test. If this is negative we will schedule a 24 hour EEG.   Electroencephalogram (EEG) Instructions  To prepare for your EEG: . Wash your hair the night before or the day of the test, but don't use any conditioners, hair creams, sprays or styling gels.  . Avoid anything with caffeine 6 hours before the test. . Take your usual medications unless instructed otherwise. If you're supposed to sleep during your EEG test, your doctor may ask you to sleep less or even avoid sleep entirely the night before your EEG. If you have trouble falling asleep for the test, you might be given a sedative to help you relax.   What to expect You'll feel little or no discomfort during an EEG. The electrodes don't transmit any sensations. They just record your brain waves. If you need to sleep during the EEG, you might be given a sedative beforehand to help you relax. During the test: . A technician measures your head and marks your scalp with a type of pencil, to indicate where to attach the electrodes. Those spots on your scalp may be scrubbed with a gritty cream to improve the quality of the recording.  . A technician attaches flat metal discs (electrodes) to your scalp using a special adhesive. The electrodes are connected with wires to an instrument that amplifies - makes bigger - the brain waves and records them on computer equipment. Some people wear and elastic cap fitted with electrodes, instead of having the adhesive applied to their scalps. Once the electrodes are in place, an EEG typically takes 30-60 minutes.  . You relax in a comfortable position with your eyes closed during the test. At various  times, the technician may ask you to open and close your eyes, perform a few simple calculations, read a paragraph, look at a picture, breathe deeply (hyperventilate) for a few minutes, or look at a flashing light. .  After the test After the test, the technician removes the electrodes or cap. If no sedative was given, you should feel no side effects after the procedure, and you can return to your normal routine.  If you used a sedative, it may take about an hour to partially recover from the medication. You'll need someone to take you home because it can take up to a day for the full effects of the sedative to wear off. Rest and don't drive for the remainder of the day.

## 2016-03-17 ENCOUNTER — Telehealth: Payer: Self-pay | Admitting: Neurology

## 2016-03-17 NOTE — Telephone Encounter (Signed)
LMOM making patient aware labs are good.

## 2016-03-17 NOTE — Telephone Encounter (Signed)
-----   Message from Octaviano Battyebecca S Tat, DO sent at 03/17/2016  8:00 AM EST ----- Let pt know that liver enzymes are much better!  congrats and keep up the good work!  Other labs including TSH are normal as well.

## 2016-03-30 ENCOUNTER — Ambulatory Visit (INDEPENDENT_AMBULATORY_CARE_PROVIDER_SITE_OTHER): Payer: Self-pay | Admitting: Neurology

## 2016-03-30 DIAGNOSIS — D696 Thrombocytopenia, unspecified: Secondary | ICD-10-CM

## 2016-03-30 DIAGNOSIS — G253 Myoclonus: Secondary | ICD-10-CM

## 2016-03-30 DIAGNOSIS — R251 Tremor, unspecified: Secondary | ICD-10-CM

## 2016-03-30 DIAGNOSIS — G40909 Epilepsy, unspecified, not intractable, without status epilepticus: Secondary | ICD-10-CM

## 2016-03-31 ENCOUNTER — Telehealth: Payer: Self-pay | Admitting: Neurology

## 2016-03-31 NOTE — Telephone Encounter (Signed)
LMOM making patient aware EEG okay and will proceed with 24 hour EEG. Darl PikesSusan, EEG Tech, will call him to schedule. He will call with any questions.

## 2016-03-31 NOTE — Procedures (Signed)
TECHNICAL SUMMARY:  A multichannel referential and bipolar montage EEG using the standard international 10-20 system was performed on the patient described as awake and drowsy.  The dominant background activity consists of 8.5-9 hertz activity seen most prominantly over the posterior head region.  The backgound activity is nonreactive to eye opening and closing procedures.  Low voltage fast (beta) activity is distributed symmetrically and maximally over the anterior head regions.  ACTIVATION:  Stepwise photic stimulation at 4-20 flashes per second was performed and did not elicit any abnormal waveforms but did produce a symmetric driving response.  Hyperventilation was performed for 3 minutes with good patient effort and produced no changes in the background activity.  EPILEPTIFORM ACTIVITY:  There were no spikes, sharp waves or paroxysmal activity.  SLEEP:  Physiologic drowsiness is noted but no stage II sleep  CARDIAC:  The EKG lead revealed a regular sinus rhythm.  IMPRESSION:  This is a normal EEG for the patients stated age.  There were no focal, hemispheric or lateralizing features.  No epileptiform activity was recorded.  A normal EEG does not exclude the diagnosis of a seizure disorder and if seizure remains high on the list of differential diagnosis, an ambulatory EEG may be of value.  Clinical correlation is required.

## 2016-03-31 NOTE — Telephone Encounter (Signed)
-----   Message from Octaviano Battyebecca S Tat, DO sent at 03/31/2016  7:39 AM EST ----- Let pt know results and can proceed with ambulatory if sx's are persisting at night

## 2016-04-08 ENCOUNTER — Ambulatory Visit (INDEPENDENT_AMBULATORY_CARE_PROVIDER_SITE_OTHER): Payer: Self-pay | Admitting: Neurology

## 2016-04-08 DIAGNOSIS — R251 Tremor, unspecified: Secondary | ICD-10-CM

## 2016-04-08 DIAGNOSIS — G40909 Epilepsy, unspecified, not intractable, without status epilepticus: Secondary | ICD-10-CM

## 2016-04-08 DIAGNOSIS — D696 Thrombocytopenia, unspecified: Secondary | ICD-10-CM

## 2016-04-08 DIAGNOSIS — G253 Myoclonus: Secondary | ICD-10-CM

## 2016-04-13 ENCOUNTER — Ambulatory Visit: Payer: Self-pay | Attending: Family Medicine | Admitting: Family Medicine

## 2016-04-13 ENCOUNTER — Encounter: Payer: Self-pay | Admitting: Licensed Clinical Social Worker

## 2016-04-13 ENCOUNTER — Encounter: Payer: Self-pay | Admitting: Family Medicine

## 2016-04-13 VITALS — BP 103/68 | HR 86 | Temp 97.9°F | Resp 18 | Ht 66.0 in | Wt 158.6 lb

## 2016-04-13 DIAGNOSIS — Z79899 Other long term (current) drug therapy: Secondary | ICD-10-CM | POA: Insufficient documentation

## 2016-04-13 DIAGNOSIS — F325 Major depressive disorder, single episode, in full remission: Secondary | ICD-10-CM

## 2016-04-13 DIAGNOSIS — J309 Allergic rhinitis, unspecified: Secondary | ICD-10-CM

## 2016-04-13 DIAGNOSIS — F329 Major depressive disorder, single episode, unspecified: Secondary | ICD-10-CM | POA: Insufficient documentation

## 2016-04-13 DIAGNOSIS — Z09 Encounter for follow-up examination after completed treatment for conditions other than malignant neoplasm: Secondary | ICD-10-CM

## 2016-04-13 DIAGNOSIS — Z0001 Encounter for general adult medical examination with abnormal findings: Secondary | ICD-10-CM | POA: Insufficient documentation

## 2016-04-13 MED ORDER — SERTRALINE HCL 50 MG PO TABS: 50.0000 mg | ORAL_TABLET | Freq: Every day | ORAL | 3 refills | Status: DC

## 2016-04-13 MED ORDER — FEXOFENADINE HCL 180 MG PO TABS: 180.0000 mg | ORAL_TABLET | Freq: Every day | ORAL | 11 refills | Status: DC

## 2016-04-13 MED ORDER — TRAZODONE HCL 50 MG PO TABS: 50.0000 mg | ORAL_TABLET | Freq: Every day | ORAL | 3 refills | Status: DC

## 2016-04-13 NOTE — Progress Notes (Signed)
Patient is her for F/up seizure disorder, med refill  Patient has eaten today & has taking his meds today  Patient complains about both rear leg muscle feeling tight cramping pain level a 2-3

## 2016-04-13 NOTE — Patient Instructions (Addendum)
Keep scheduled follow up with neurologist

## 2016-04-13 NOTE — Progress Notes (Signed)
Subjective:  Patient ID: Timothy [name], male    DOB: [birth year]  Age: 41 y.o. MRN: [id number]  CC: Establish Care   HPI Timothy [name] presents for   F/U: Reports following up with neurologist for EEG study for tremors. Denies any difficulty keeping his balance, weakness, or vision changes.  Depression: History of major depression with alcohol abuse. Reports abstaining from alcohol since [place].2017. Denies any SI/HI. Request medication refills.  Allergic rhinitis: Reports Claritin provides moderate relief of symptoms. Request something stronger.   Outpatient Medications Prior to Visit  Medication Sig Dispense Refill  . acetaminophen (TYLENOL) 500 MG tablet Take 1,000 mg by mouth every 6 (six) hours as needed.    . loratadine (CLARITIN) 10 MG tablet Take 1 tablet (10 mg total) by mouth daily. 30 tablet 2  . Multiple Vitamins-Minerals (MULTIVITAMIN) tablet Take 1 tablet by mouth daily. 30 tablet 0  . thiamine 100 MG tablet Take 1 tablet (100 mg total) by mouth daily. 30 tablet 0  . sertraline (ZOLOFT) 50 MG tablet Take 1 tablet (50 mg total) by mouth daily. 30 tablet 3  . traZODone (DESYREL) 50 MG tablet Take 1 tablet (50 mg total) by mouth at bedtime. 30 tablet 3   No facility-administered medications prior to visit.     ROS Review of Systems  Respiratory: Negative.   Cardiovascular: Negative.   Gastrointestinal: Negative.   Neurological: Positive for tremors.     Objective:  BP 103/68 (BP Location: Left Arm, Patient Position: Sitting, Cuff Size: Normal)   Pulse 86   Temp 97.9 F (36.6 C) (Oral)   Resp 18   Ht 5\' 6"  (1.676 m)   Wt 158 lb 9.6 oz (71.9 kg)   SpO2 100%   BMI 25.60 kg/m   BP/Weight 04/13/2016 03/16/2016 02/14/2016  Systolic BP 103 110 105  Diastolic BP 68 68 71  Wt. (Lbs) 158.6 151 143.4  BMI 25.6 24.37 23.15     Physical Exam  Constitutional: He is oriented to person, place, and time.  Eyes: Pupils are equal, round, and reactive to  light.  Neck: Normal range of motion. No JVD present.  Cardiovascular: Normal rate, regular rhythm, normal heart sounds and intact distal pulses.   Pulmonary/Chest: Effort normal and breath sounds normal.  Abdominal: Soft. Bowel sounds are normal.  Neurological: He is alert and oriented to person, place, and time. He has normal reflexes. He displays a negative Romberg sign.  Skin: Skin is warm and dry.  Nursing note and vitals reviewed.  Assessment & Plan:   Problem List Items Addressed This Visit      Other   Major depressive disorder, single episode   Relevant Medications   sertraline (ZOLOFT) 50 MG tablet   traZODone (DESYREL) 50 MG tablet    Other Visit Diagnoses    Follow up    -  Primary   Allergic rhinitis, unspecified chronicity, unspecified seasonality, unspecified trigger       Relevant Medications   fexofenadine (ALLEGRA ALLERGY) 180 MG tablet      Meds ordered this encounter  Medications  . sertraline (ZOLOFT) 50 MG tablet    Sig: Take 1 tablet (50 mg total) by mouth daily.    Dispense:  30 tablet    Refill:  3    Order Specific Question:   Supervising Provider    Answer:   Quentin Angst [id number]  . traZODone (DESYREL) 50 MG tablet    Sig: Take 1 tablet (50 mg total)  by mouth at bedtime.    Dispense:  30 tablet    Refill:  3    Order Specific Question:   Supervising Provider    Answer:   Quentin AngstJEGEDE, OLUGBEMIGA E [id number]  . fexofenadine (ALLEGRA ALLERGY) 180 MG tablet    Sig: Take 1 tablet (180 mg total) by mouth daily.    Dispense:  30 tablet    Refill:  11    Order Specific Question:   Supervising Provider    Answer:   Quentin AngstJEGEDE, OLUGBEMIGA E [[id number]]    Follow-up: Return in about 3 months (around 07/14/2016) for Depression.   Lizbeth BarkMandesia R Hairston FNP

## 2016-04-13 NOTE — BH Specialist Note (Signed)
Session Start time: 5:00 PM   End Time: 5:15 PM Total Time:  15 minutes Type of Service: Behavioral Health - Individual/Family Interpreter: No.   Interpreter Name & Language: N/A # [facility]   SUBJECTIVE: Timothy [name] is a 41 y.o. male  Pt. was referred by FNP Hairston for:  depression. Pt. reports the following symptoms/concerns: difficulty staying asleep, racing thoughts, overwhelming feelings of sadness, and hx of substance use  Duration of problem:  Pt used substances (alcohol) for 15 years. He has completed detox and has maintained sobriety for approximately three months Severity: mild Previous treatment: None reported   OBJECTIVE: Mood: Pleasant & Affect: Appropriate Risk of harm to self or others: Pt denied SI/HI/AVH Assessments administered: PHQ-9; GAD-7  LIFE CONTEXT:  Family & Social: Pt resides with his wife. He receives additional emotional support from his parents  School/ Work: Pt is employed full time as a Merchandiser, retailsupervisor for a call center. His wife is employed, as well Self-Care: Pt recently received a smart phone and plans to download mental health apps to assist with depression and anxiety. He is interested in starting an exercise regime (yoga and swimming) Life changes: Pt has maintained sobriety for approximately three months What is important to pt/family (values): Family, Good Health, Independence   GOALS ADDRESSED:  Decrease symptoms of depression and anxiety Increase knowledge of coping skills  INTERVENTIONS: Strength-based and Supportive   ASSESSMENT:  Pt currently experiencing difficulty managing depression since maintaining sobriety from alcohol and tobacco for approximately three months. Pt reports difficulty staying asleep, racing thoughts, overwhelming feelings of sadness, and substance use history. Pt receives strong support from family and was accompanied by mother during visit. Pt may benefit from psychotherapy and  medication management. Pt plans on implementing new exercise regime (yoga and swimming) in addition to utilizing mental health apps on his smart phone. LCSWA commended pt on his willingness to try apply different coping skills and encouraged him to strengthen his support system. LCSWA provided suggestions, such as, AA meetings and/or psychotherapy. Pt is not interested in initiating therapy or participating in AA at this time; however, is open to medication management.        PLAN: 1. F/U with behavioral health clinician: Pt was encouraged tocontact LCSWA if symptoms worsen or fail to improveto schedule behavioral appointments at [facility]. 2. Behavioral Health meds: Zoloft and Desyrel 3. Behavioral recommendations: LCSWA recommends that pt apply healthy coping skills discussed and utilize community resources to assist in sobriety. Pt is encouraged to schedule follow up appointment with LCSWA 4. Referral: Problem-solving teaching/coping strategies and Supportive Counseling 5. From scale of 1-10, how likely are you to follow plan: 9/10   Bridgett LarssonJasmine D [name], MSW, [facility] HospitalCSWA  Clinical Social Worker 04/14/16 9:28 AM  Marlon PelWarmhandoff:   Warm Hand Off Completed.

## 2016-04-15 ENCOUNTER — Telehealth: Payer: Self-pay | Admitting: Neurology

## 2016-04-15 NOTE — Telephone Encounter (Signed)
EEG not yet read. Patient made aware we will call with results once it has been interpreted.

## 2016-04-15 NOTE — Telephone Encounter (Signed)
Patient wants the results of the 24 hour EEG please call [phone]

## 2016-04-19 NOTE — Procedures (Signed)
ELECTROENCEPHALOGRAM REPORT  Dates of Recording: 04/08/2016 11:44AM to 03/01/201810:49AM  Patient's Name: Timothy [name] MRN: [id number] Date of Birth: 08/27/1975  Referring Provider: Dr. 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 EEG for classification.  Medications: Tylenol, Claritin, Zoloft, Trazodone  Technical Summary: This is a 24-hour multichannel digital EEG recording measured by the international 10-20 system with electrodes applied with paste and impedances below 5000 ohms performed as portable with EKG monitoring.  The digital EEG was referentially recorded, reformatted, and digitally filtered in a variety of bipolar and referential montages for optimal display.    DESCRIPTION OF RECORDING: During maximal wakefulness, the background activity consisted of a symmetric 9 Hz posterior dominant rhythm which was reactive to eye opening.  There were no epileptiform discharges or focal slowing seen in wakefulness.  During the recording, the patient progresses through wakefulness, drowsiness, and Stage 2 sleep.  Again, there were no epileptiform discharges seen.  Events: On 02/28 between 1445 and 1500 hours he reports right leg twitching. Electrographically, there were no EEG or EKG changes seen.  On 02/28 at 1735 hours, he reports left leg twitching while standing. Electrographically, there were no EEG or EKG changes seen.  On 02/28 at 1819 hours, he reports left leg twitch. Electrographically, there were no EEG or EKG changes seen.  On 02/28 at 2100 hours, he has lower back spasm. Electrographically, there were no EEG or EKG changes seen.  On 02/28 at 2156 hours, he lost feeling in left knee, leg buckles, lost balance. Electrographically, there were no EEG or EKG changes seen.  There were no electrographic seizures seen.  EKG lead was unremarkable.  IMPRESSION: This 24-hour  ambulatory EEG study is normal.    CLINICAL CORRELATION: A normal EEG does not exclude a clinical diagnosis of epilepsy. Episodes of leg twitching and left knee numbness with leg buckling did not show any electrographic correlate. If further clinical questions remain, inpatient video EEG monitoring may be helpful.   Patrcia DollyKaren Aquino, M.D.

## 2016-04-20 ENCOUNTER — Telehealth: Payer: Self-pay | Admitting: Neurology

## 2016-04-20 NOTE — Telephone Encounter (Signed)
-----   Message from Van ClinesKaren M Aquino, MD sent at 04/20/2016 10:38 AM EDT ----- Pls let him know the EEG was normal, thanks

## 2016-04-20 NOTE — Telephone Encounter (Signed)
LMOM making patient aware EEG okay.

## 2016-05-25 ENCOUNTER — Telehealth: Payer: Self-pay | Admitting: Family Medicine

## 2016-05-25 NOTE — Telephone Encounter (Signed)
Left patient a message informing him that his 6/6 appointment because provider will not be in the office.

## 2016-07-06 ENCOUNTER — Other Ambulatory Visit: Payer: Self-pay | Admitting: Family Medicine

## 2016-07-06 DIAGNOSIS — F325 Major depressive disorder, single episode, in full remission: Secondary | ICD-10-CM

## 2016-07-12 ENCOUNTER — Other Ambulatory Visit: Payer: Self-pay | Admitting: Family Medicine

## 2016-07-12 DIAGNOSIS — F325 Major depressive disorder, single episode, in full remission: Secondary | ICD-10-CM

## 2016-07-15 ENCOUNTER — Telehealth: Payer: Self-pay | Admitting: Family Medicine

## 2016-07-15 ENCOUNTER — Ambulatory Visit: Payer: Self-pay | Admitting: Family Medicine

## 2016-07-15 ENCOUNTER — Other Ambulatory Visit: Payer: Self-pay | Admitting: Family Medicine

## 2016-07-15 DIAGNOSIS — F325 Major depressive disorder, single episode, in full remission: Secondary | ICD-10-CM

## 2016-07-15 MED ORDER — TRAZODONE HCL 50 MG PO TABS: 50.0000 mg | ORAL_TABLET | Freq: Every day | ORAL | 0 refills | Status: DC

## 2016-07-15 NOTE — Telephone Encounter (Signed)
[facility] Recommend scheduling follow up appointment for follow up.

## 2016-07-15 NOTE — Telephone Encounter (Signed)
Pt calling to request a refill of Trazodone. Please f/u.

## 2016-07-16 NOTE — Telephone Encounter (Signed)
CMA call regarding medication refill   Patient did not answer & No VM set up

## 2016-07-23 ENCOUNTER — Ambulatory Visit: Payer: Self-pay | Attending: Family Medicine | Admitting: Family Medicine

## 2016-07-23 VITALS — BP 129/78 | HR 84 | Temp 98.2°F | Resp 18 | Ht 66.0 in | Wt 169.0 lb

## 2016-07-23 DIAGNOSIS — Z8659 Personal history of other mental and behavioral disorders: Secondary | ICD-10-CM

## 2016-07-23 DIAGNOSIS — Z79899 Other long term (current) drug therapy: Secondary | ICD-10-CM | POA: Insufficient documentation

## 2016-07-23 DIAGNOSIS — T887XXA Unspecified adverse effect of drug or medicament, initial encounter: Secondary | ICD-10-CM

## 2016-07-23 DIAGNOSIS — Z8709 Personal history of other diseases of the respiratory system: Secondary | ICD-10-CM

## 2016-07-23 DIAGNOSIS — F325 Major depressive disorder, single episode, in full remission: Secondary | ICD-10-CM

## 2016-07-23 DIAGNOSIS — F329 Major depressive disorder, single episode, unspecified: Secondary | ICD-10-CM | POA: Insufficient documentation

## 2016-07-23 MED ORDER — SERTRALINE HCL 50 MG PO TABS: 50.0000 mg | ORAL_TABLET | Freq: Every day | ORAL | 6 refills | Status: DC

## 2016-07-23 MED ORDER — FEXOFENADINE HCL 180 MG PO TABS: 180.0000 mg | ORAL_TABLET | Freq: Every day | ORAL | 11 refills | Status: DC

## 2016-07-23 MED ORDER — TRAZODONE HCL 50 MG PO TABS: 25.0000 mg | ORAL_TABLET | Freq: Every evening | ORAL | 6 refills | Status: DC | PRN

## 2016-07-23 NOTE — Progress Notes (Signed)
Subjective:  Patient ID: Timothy [name], male    DOB: 11/03/1975  Age: 41 y.o. MRN: [id number]  CC: Follow-up   HPI Timothy [name] presents for depression follow up. Patient complains of depression. He complains of depressed mood and difficulty concentrating. Onset was approximately several years ago, stable since that time.  He denies current suicidal and homicidal plan or intent.   Family history significant for no psychiatric illness.Possible organic causes contributing are: history of alcohol abuse.  Risk factors: alcoholism Previous treatment includes Zoloft and Trazadone . Reports improvement of depressive symptoms with medication use. He complains of increased drowsiness and difficulty maintaining an erection. Denies any changes to bladder or bowel habits. Denies taking anything for symptoms.    Outpatient Medications Prior to Visit  Medication Sig Dispense Refill  . acetaminophen (TYLENOL) 500 MG tablet Take 1,000 mg by mouth every 6 (six) hours as needed.    . Multiple Vitamins-Minerals (MULTIVITAMIN) tablet Take 1 tablet by mouth daily. 30 tablet 0  . thiamine 100 MG tablet Take 1 tablet (100 mg total) by mouth daily. 30 tablet 0  . fexofenadine (ALLEGRA ALLERGY) 180 MG tablet Take 1 tablet (180 mg total) by mouth daily. 30 tablet 11  . loratadine (CLARITIN) 10 MG tablet Take 1 tablet (10 mg total) by mouth daily. 30 tablet 2  . sertraline (ZOLOFT) 50 MG tablet Take 1 tablet (50 mg total) by mouth daily. 30 tablet 3  . traZODone (DESYREL) 50 MG tablet Take 1 tablet (50 mg total) by mouth at bedtime. 30 tablet 0   No facility-administered medications prior to visit.     ROS Review of Systems  Constitutional: Negative.   HENT: Negative.   Eyes: Negative.   Respiratory: Negative.   Cardiovascular: Negative.   Genitourinary:       Erectile dysfunction  Skin: Negative.   Neurological:       Drowsiness  Psychiatric/Behavioral: Positive for dysphoric mood  (history of depression). Negative for suicidal ideas.    Objective:  BP 129/78 (BP Location: Left Arm, Patient Position: Sitting, Cuff Size: Normal)   Pulse 84   Temp 98.2 F (36.8 C) (Oral)   Resp 18   Ht 5\' 6"  (1.676 m)   Wt 169 lb (76.7 kg)   SpO2 98%   BMI 27.28 kg/m   BP/Weight 07/23/2016 04/13/2016 03/16/2016  Systolic BP 129 103 110  Diastolic BP 78 68 68  Wt. (Lbs) 169 158.6 151  BMI 27.28 25.6 24.37    Physical Exam  Constitutional: He is oriented to person, place, and time. He appears well-developed and well-nourished.  Eyes: Conjunctivae are normal. Pupils are equal, round, and reactive to light.  Neck: No JVD present.  Cardiovascular: Normal rate, regular rhythm, normal heart sounds and intact distal pulses.   Pulmonary/Chest: Effort normal and breath sounds normal.  Abdominal: Soft. Bowel sounds are normal.  Musculoskeletal: Normal range of motion.  Neurological: He is alert and oriented to person, place, and time. He has normal reflexes.  Skin: Skin is warm and dry.  Psychiatric: His speech is normal. His affect is not inappropriate. He expresses no homicidal and no suicidal ideation. He expresses no suicidal plans and no homicidal plans.  Nursing note and vitals reviewed.  Assessment & Plan:   Problem List Items Addressed This Visit      Other   Major depressive disorder, single episode   Relevant Medications   traZODone (DESYREL) 50 MG tablet   sertraline (ZOLOFT) 50 MG tablet  Other Visit Diagnoses    History of major depression    -  Primary   Medication side effect       Decrease dosage of trazodone to 25 mg to 50 mg QHSPRN   If symptoms of ed still persist recommend follow up.    History of allergic rhinitis       Relevant Medications   fexofenadine (ALLEGRA ALLERGY) 180 MG tablet      Meds ordered this encounter  Medications  . traZODone (DESYREL) 50 MG tablet    Sig: Take 0.5-1 tablets (25-50 mg total) by mouth at bedtime as needed for  sleep.    Dispense:  30 tablet    Refill:  6    Please consider 90 day supplies to promote better adherence    Order Specific Question:   Supervising Provider    Answer:   Quentin Angst [id number]  . fexofenadine (ALLEGRA ALLERGY) 180 MG tablet    Sig: Take 1 tablet (180 mg total) by mouth daily.    Dispense:  30 tablet    Refill:  11    Order Specific Question:   Supervising Provider    Answer:   Quentin Angst [id number]  . sertraline (ZOLOFT) 50 MG tablet    Sig: Take 1 tablet (50 mg total) by mouth daily.    Dispense:  30 tablet    Refill:  6    Order Specific Question:   Supervising Provider    Answer:   Quentin Angst [[id number]]    Follow-up: Return in about 8 weeks (around 09/17/2016) for Depression.   Lizbeth Bark FNP

## 2016-07-23 NOTE — Patient Instructions (Signed)
Living With Depression Everyone experiences occasional disappointment, sadness, and loss in their lives. When you are feeling down, blue, or sad for at least 2 weeks in a row, it may mean that you have depression. Depression can affect your thoughts and feelings, relationships, daily activities, and physical health. It is caused by changes in the way your brain functions. If you receive a diagnosis of depression, your health care provider will tell you which type of depression you have and what treatment options are available to you. If you are living with depression, there are ways to help you recover from it and also ways to prevent it from coming back. How to cope with lifestyle changes Coping with stress Stress is your body's reaction to life changes and events, both good and bad. Stressful situations may include:  Getting married.  The death of a spouse.  Losing a job.  Retiring.  Having a baby.  Stress can last just a few hours or it can be ongoing. Stress can play a major role in depression, so it is important to learn both how to cope with stress and how to think about it differently. Talk with your health care provider or a counselor if you would like to learn more about stress reduction. He or she may suggest some stress reduction techniques, such as:  Music therapy. This can include creating music or listening to music. Choose music that you enjoy and that inspires you.  Mindfulness-based meditation. This kind of meditation can be done while sitting or walking. It involves being aware of your normal breaths, rather than trying to control your breathing.  Centering prayer. This is a kind of meditation that involves focusing on a spiritual word or phrase. Choose a word, phrase, or sacred image that is meaningful to you and that brings you peace.  Deep breathing. To do this, expand your stomach and inhale slowly through your nose. Hold your breath for 3-5 seconds, then exhale  slowly, allowing your stomach muscles to relax.  Muscle relaxation. This involves intentionally tensing muscles then relaxing them.  Choose a stress reduction technique that fits your lifestyle and personality. Stress reduction techniques take time and practice to develop. Set aside 5-15 minutes a day to do them. Therapists can offer training in these techniques. The training may be covered by some insurance plans. Other things you can do to manage stress include:  Keeping a stress diary. This can help you learn what triggers your stress and ways to control your response.  Understanding what your limits are and saying no to requests or events that lead to a schedule that is too full.  Thinking about how you respond to certain situations. You may not be able to control everything, but you can control how you react.  Adding humor to your life by watching funny films or TV shows.  Making time for activities that help you relax and not feeling guilty about spending your time this way.  Medicines Your health care provider may suggest certain medicines if he or she feels that they will help improve your condition. Avoid using alcohol and other substances that may prevent your medicines from working properly (may interact). It is also important to:  Talk with your pharmacist or health care provider about all the medicines that you take, their possible side effects, and what medicines are safe to take together.  Make it your goal to take part in all treatment decisions (shared decision-making). This includes giving input on the side   effects of medicines. It is best if shared decision-making with your health care provider is part of your total treatment plan.  If your health care provider prescribes a medicine, you may not notice the full benefits of it for 4-8 weeks. Most people who are treated for depression need to be on medicine for at least 6-12 months after they feel better. If you are taking  medicines as part of your treatment, do not stop taking medicines without first talking to your health care provider. You may need to have the medicine slowly decreased (tapered) over time to decrease the risk of harmful side effects. Relationships Your health care provider may suggest family therapy along with individual therapy and drug therapy. While there may not be family problems that are causing you to feel depressed, it is still important to make sure your family learns as much as they can about your mental health. Having your family's support can help make your treatment successful. How to recognize changes in your condition Everyone has a different response to treatment for depression. Recovery from major depression happens when you have not had signs of major depression for two months. This may mean that you will start to:  Have more interest in doing activities.  Feel less hopeless than you did 2 months ago.  Have more energy.  Overeat less often, or have better or improving appetite.  Have better concentration.  Your health care provider will work with you to decide the next steps in your recovery. It is also important to recognize when your condition is getting worse. Watch for these signs:  Having fatigue or low energy.  Eating too much or too little.  Sleeping too much or too little.  Feeling restless, agitated, or hopeless.  Having trouble concentrating or making decisions.  Having unexplained physical complaints.  Feeling irritable, angry, or aggressive.  Get help as soon as you or your family members notice these symptoms coming back. How to get support and help from others How to talk with friends and family members about your condition Talking to friends and family members about your condition can provide you with one way to get support and guidance. Reach out to trusted friends or family members, explain your symptoms to them, and let them know that you are  working with a health care provider to treat your depression. Financial resources Not all insurance plans cover mental health care, so it is important to check with your insurance carrier. If paying for co-pays or counseling services is a problem, search for a local or county mental health care center. They may be able to offer public mental health care services at low or no cost when you are not able to see a private health care provider. If you are taking medicine for depression, you may be able to get the generic form, which may be less expensive. Some makers of prescription medicines also offer help to patients who cannot afford the medicines they need. Follow these instructions at home:  Get the right amount and quality of sleep.  Cut down on using caffeine, tobacco, alcohol, and other potentially harmful substances.  Try to exercise, such as walking or lifting small weights.  Take over-the-counter and prescription medicines only as told by your health care provider.  Eat a healthy diet that includes plenty of vegetables, fruits, whole grains, low-fat dairy products, and lean protein. Do not eat a lot of foods that are high in solid fats, added sugars, or salt.    Keep all follow-up visits as told by your health care provider. This is important. Contact a health care provider if:  You stop taking your antidepressant medicines, and you have any of these symptoms: ? Nausea. ? Headache. ? Feeling lightheaded. ? Chills and body aches. ? Not being able to sleep (insomnia).  You or your friends and family think your depression is getting worse. Get help right away if:  You have thoughts of hurting yourself or others. If you ever feel like you may hurt yourself or others, or have thoughts about taking your own life, get help right away. You can go to your nearest emergency department or call:  Your local emergency services (911 in the [place]  A suicide crisis helpline, such as the  National Suicide Prevention Lifeline at 1-800-273-8255. This is open 24-hours a day.  Summary  If you are living with depression, there are ways to help you recover from it and also ways to prevent it from coming back.  Work with your health care team to create a management plan that includes counseling, stress management techniques, and healthy lifestyle habits. This information is not intended to replace advice given to you by your health care provider. Make sure you discuss any questions you have with your health care provider. Document Released: 12/30/2015 Document Revised: 12/30/2015 Document Reviewed: 12/30/2015 Elsevier Interactive Patient Education  2018 [employer] Inc.  

## 2016-07-23 NOTE — Progress Notes (Signed)
Patient is here for f/up Dep

## 2016-09-17 ENCOUNTER — Ambulatory Visit: Payer: Self-pay | Admitting: Family Medicine

## 2016-12-25 ENCOUNTER — Ambulatory Visit: Payer: Self-pay | Attending: Family Medicine | Admitting: Family Medicine

## 2016-12-25 ENCOUNTER — Encounter: Payer: Self-pay | Admitting: Family Medicine

## 2016-12-25 VITALS — BP 131/83 | HR 88 | Temp 98.3°F | Resp 18 | Ht 66.0 in | Wt 170.6 lb

## 2016-12-25 DIAGNOSIS — F325 Major depressive disorder, single episode, in full remission: Secondary | ICD-10-CM | POA: Insufficient documentation

## 2016-12-25 DIAGNOSIS — Z79899 Other long term (current) drug therapy: Secondary | ICD-10-CM | POA: Insufficient documentation

## 2016-12-25 DIAGNOSIS — Z23 Encounter for immunization: Secondary | ICD-10-CM | POA: Insufficient documentation

## 2016-12-25 DIAGNOSIS — Z760 Encounter for issue of repeat prescription: Secondary | ICD-10-CM | POA: Insufficient documentation

## 2016-12-25 DIAGNOSIS — Z8709 Personal history of other diseases of the respiratory system: Secondary | ICD-10-CM | POA: Insufficient documentation

## 2016-12-25 MED ORDER — TRAZODONE HCL 50 MG PO TABS: 25.0000 mg | ORAL_TABLET | Freq: Every evening | ORAL | 6 refills | Status: DC | PRN

## 2016-12-25 MED ORDER — FEXOFENADINE HCL 180 MG PO TABS: 180.0000 mg | ORAL_TABLET | Freq: Every day | ORAL | 11 refills | Status: DC

## 2016-12-25 MED ORDER — SERTRALINE HCL 100 MG PO TABS
100.0000 mg | ORAL_TABLET | Freq: Every day | ORAL | 6 refills | Status: DC
Start: 2016-12-25 — End: 2017-02-11

## 2016-12-25 MED ORDER — THIAMINE HCL 100 MG PO TABS: 100.0000 mg | ORAL_TABLET | Freq: Every day | ORAL | 6 refills | Status: DC

## 2016-12-25 MED ORDER — THERA VITAL M PO TABS: 1.0000 | ORAL_TABLET | Freq: Every day | ORAL | 6 refills | Status: DC

## 2016-12-25 NOTE — Patient Instructions (Signed)
Living With Depression Everyone experiences occasional disappointment, sadness, and loss in their lives. When you are feeling down, blue, or sad for at least 2 weeks in a row, it may mean that you have depression. Depression can affect your thoughts and feelings, relationships, daily activities, and physical health. It is caused by changes in the way your brain functions. If you receive a diagnosis of depression, your health care provider will tell you which type of depression you have and what treatment options are available to you. If you are living with depression, there are ways to help you recover from it and also ways to prevent it from coming back. How to cope with lifestyle changes Coping with stress Stress is your body's reaction to life changes and events, both good and bad. Stressful situations may include:  Getting married.  The death of a spouse.  Losing a job.  Retiring.  Having a baby.  Stress can last just a few hours or it can be ongoing. Stress can play a major role in depression, so it is important to learn both how to cope with stress and how to think about it differently. Talk with your health care provider or a counselor if you would like to learn more about stress reduction. He or she may suggest some stress reduction techniques, such as:  Music therapy. This can include creating music or listening to music. Choose music that you enjoy and that inspires you.  Mindfulness-based meditation. This kind of meditation can be done while sitting or walking. It involves being aware of your normal breaths, rather than trying to control your breathing.  Centering prayer. This is a kind of meditation that involves focusing on a spiritual word or phrase. Choose a word, phrase, or sacred image that is meaningful to you and that brings you peace.  Deep breathing. To do this, expand your stomach and inhale slowly through your nose. Hold your breath for 3-5 seconds, then exhale  slowly, allowing your stomach muscles to relax.  Muscle relaxation. This involves intentionally tensing muscles then relaxing them.  Choose a stress reduction technique that fits your lifestyle and personality. Stress reduction techniques take time and practice to develop. Set aside 5-15 minutes a day to do them. Therapists can offer training in these techniques. The training may be covered by some insurance plans. Other things you can do to manage stress include:  Keeping a stress diary. This can help you learn what triggers your stress and ways to control your response.  Understanding what your limits are and saying no to requests or events that lead to a schedule that is too full.  Thinking about how you respond to certain situations. You may not be able to control everything, but you can control how you react.  Adding humor to your life by watching funny films or TV shows.  Making time for activities that help you relax and not feeling guilty about spending your time this way.  Medicines Your health care provider may suggest certain medicines if he or she feels that they will help improve your condition. Avoid using alcohol and other substances that may prevent your medicines from working properly (may interact). It is also important to:  Talk with your pharmacist or health care provider about all the medicines that you take, their possible side effects, and what medicines are safe to take together.  Make it your goal to take part in all treatment decisions (shared decision-making). This includes giving input on the side   effects of medicines. It is best if shared decision-making with your health care provider is part of your total treatment plan.  If your health care provider prescribes a medicine, you may not notice the full benefits of it for 4-8 weeks. Most people who are treated for depression need to be on medicine for at least 6-12 months after they feel better. If you are taking  medicines as part of your treatment, do not stop taking medicines without first talking to your health care provider. You may need to have the medicine slowly decreased (tapered) over time to decrease the risk of harmful side effects. Relationships Your health care provider may suggest family therapy along with individual therapy and drug therapy. While there may not be family problems that are causing you to feel depressed, it is still important to make sure your family learns as much as they can about your mental health. Having your family's support can help make your treatment successful. How to recognize changes in your condition Everyone has a different response to treatment for depression. Recovery from major depression happens when you have not had signs of major depression for two months. This may mean that you will start to:  Have more interest in doing activities.  Feel less hopeless than you did 2 months ago.  Have more energy.  Overeat less often, or have better or improving appetite.  Have better concentration.  Your health care provider will work with you to decide the next steps in your recovery. It is also important to recognize when your condition is getting worse. Watch for these signs:  Having fatigue or low energy.  Eating too much or too little.  Sleeping too much or too little.  Feeling restless, agitated, or hopeless.  Having trouble concentrating or making decisions.  Having unexplained physical complaints.  Feeling irritable, angry, or aggressive.  Get help as soon as you or your family members notice these symptoms coming back. How to get support and help from others How to talk with friends and family members about your condition Talking to friends and family members about your condition can provide you with one way to get support and guidance. Reach out to trusted friends or family members, explain your symptoms to them, and let them know that you are  working with a health care provider to treat your depression. Financial resources Not all insurance plans cover mental health care, so it is important to check with your insurance carrier. If paying for co-pays or counseling services is a problem, search for a local or county mental health care center. They may be able to offer public mental health care services at low or no cost when you are not able to see a private health care provider. If you are taking medicine for depression, you may be able to get the generic form, which may be less expensive. Some makers of prescription medicines also offer help to patients who cannot afford the medicines they need. Follow these instructions at home:  Get the right amount and quality of sleep.  Cut down on using caffeine, tobacco, alcohol, and other potentially harmful substances.  Try to exercise, such as walking or lifting small weights.  Take over-the-counter and prescription medicines only as told by your health care provider.  Eat a healthy diet that includes plenty of vegetables, fruits, whole grains, low-fat dairy products, and lean protein. Do not eat a lot of foods that are high in solid fats, added sugars, or salt.    Keep all follow-up visits as told by your health care provider. This is important. Contact a health care provider if:  You stop taking your antidepressant medicines, and you have any of these symptoms: ? Nausea. ? Headache. ? Feeling lightheaded. ? Chills and body aches. ? Not being able to sleep (insomnia).  You or your friends and family think your depression is getting worse. Get help right away if:  You have thoughts of hurting yourself or others. If you ever feel like you may hurt yourself or others, or have thoughts about taking your own life, get help right away. You can go to your nearest emergency department or call:  Your local emergency services (911 in the [place]  A suicide crisis helpline, such as the  National Suicide Prevention Lifeline at 1-800-273-8255. This is open 24-hours a day.  Summary  If you are living with depression, there are ways to help you recover from it and also ways to prevent it from coming back.  Work with your health care team to create a management plan that includes counseling, stress management techniques, and healthy lifestyle habits. This information is not intended to replace advice given to you by your health care provider. Make sure you discuss any questions you have with your health care provider. Document Released: 12/30/2015 Document Revised: 12/30/2015 Document Reviewed: 12/30/2015 Elsevier Interactive Patient Education  2018 [employer] Inc.  

## 2016-12-26 NOTE — Progress Notes (Signed)
Subjective:  Patient ID: Timothy [name], male    DOB: 02/27/1975  Age: 41 y.o. MRN: [id number]  CC: Follow-up   HPI Timothy Heathimothy Andrew Wojtas presents for depression follow up. Onset was approximately several years ago, stable since that time. He is accompanied by his mother.Symptoms include depressed mood and difficulty concentrating. Marland Kitchen.  He denies current suicidal and homicidal plan or intent.   Family history significant for no psychiatric illness.Possible organic causes contributing are: history of alcohol abuse.  Risk factors: alcoholism  Reports improvement of depressive symptoms with medication use, however he reports taking 2 tablets on "more difficult days". He does reports recent negative live event. He report his wife was recently diagnosed with breast cancer. He does reports receiving information on support groups. He does reports support from his family.  Outpatient Medications Prior to Visit  Medication Sig Dispense Refill  . acetaminophen (TYLENOL) 500 MG tablet Take 1,000 mg by mouth every 6 (six) hours as needed.    . fexofenadine (ALLEGRA ALLERGY) 180 MG tablet Take 1 tablet (180 mg total) by mouth daily. 30 tablet 11  . Multiple Vitamins-Minerals (MULTIVITAMIN) tablet Take 1 tablet by mouth daily. 30 tablet 0  . sertraline (ZOLOFT) 50 MG tablet Take 1 tablet (50 mg total) by mouth daily. 30 tablet 6  . thiamine 100 MG tablet Take 1 tablet (100 mg total) by mouth daily. 30 tablet 0  . traZODone (DESYREL) 50 MG tablet Take 0.5-1 tablets (25-50 mg total) by mouth at bedtime as needed for sleep. 30 tablet 6   No facility-administered medications prior to visit.     ROS Review of Systems  Constitutional: Negative.   Eyes: Negative.   Respiratory: Negative.   Cardiovascular: Negative.   Skin: Negative.   Neurological:       Drowsiness  Psychiatric/Behavioral: Negative for suicidal ideas. Behavioral problem: history of alcoholism. Dysphoric mood: history of MDD.     Objective:  BP 131/83 (BP Location: Left Arm, Patient Position: Sitting, Cuff Size: Normal)   Pulse 88   Temp 98.3 F (36.8 C) (Oral)   Resp 18   Ht 5\' 6"  (1.676 m)   Wt 170 lb 9.6 oz (77.4 kg)   SpO2 97%   BMI 27.54 kg/m   BP/Weight 12/25/2016 07/23/2016 04/13/2016  Systolic BP 131 129 103  Diastolic BP 83 78 68  Wt. (Lbs) 170.6 169 158.6  BMI 27.54 27.28 25.6    Physical Exam  Constitutional: He appears well-developed and well-nourished.  Eyes: Conjunctivae are normal. Pupils are equal, round, and reactive to light.  Cardiovascular: Normal rate, regular rhythm, normal heart sounds and intact distal pulses.  Pulmonary/Chest: Effort normal and breath sounds normal.  Musculoskeletal: Normal range of motion.  Skin: Skin is warm and dry.  Psychiatric: His speech is normal. His affect is not inappropriate. He expresses no homicidal and no suicidal ideation. He expresses no suicidal plans and no homicidal plans.  Nursing note and vitals reviewed.  Assessment & Plan:   1. Major depressive disorder with single episode, in remission [facility]) Given LCSW card and  counseling resource materials. Increased dosage of zoloft  - sertraline (ZOLOFT) 100 MG tablet; Take 1 tablet (100 mg total) daily by mouth.  Dispense: 30 tablet; Refill: 6 - traZODone (DESYREL) 50 MG tablet; Take 0.5-1 tablets (25-50 mg total) at bedtime as needed by mouth for sleep.  Dispense: 30 tablet; Refill: 6  2. Medication refill  - fexofenadine (ALLEGRA ALLERGY) 180 MG tablet; Take 1 tablet (180  mg total) daily by mouth.  Dispense: 30 tablet; Refill: 11 - Multiple Vitamins-Minerals (MULTIVITAMIN) tablet; Take 1 tablet daily by mouth.  Dispense: 30 tablet; Refill: 6 - thiamine 100 MG tablet; Take 1 tablet (100 mg total) daily by mouth.  Dispense: 30 tablet; Refill: 6  3. Needs flu shot  - Flu Vaccine QUAD 36+ mos IM      Follow-up: Return in about 4 months (around 04/24/2017), or if symptoms worsen or fail to  improve, for Depression.   Lizbeth BarkMandesia R Hairston FNP

## 2017-01-29 ENCOUNTER — Other Ambulatory Visit: Payer: Self-pay | Admitting: Family Medicine

## 2017-01-29 DIAGNOSIS — F325 Major depressive disorder, single episode, in full remission: Secondary | ICD-10-CM

## 2017-02-11 ENCOUNTER — Telehealth: Payer: Self-pay | Admitting: Family Medicine

## 2017-02-11 ENCOUNTER — Other Ambulatory Visit: Payer: Self-pay | Admitting: Family Medicine

## 2017-02-11 DIAGNOSIS — F325 Major depressive disorder, single episode, in full remission: Secondary | ICD-10-CM

## 2017-02-11 MED ORDER — SERTRALINE HCL 100 MG PO TABS: 100.0000 mg | ORAL_TABLET | Freq: Every day | ORAL | 2 refills | Status: DC

## 2017-02-11 NOTE — Telephone Encounter (Signed)
Patient called to request for medication to be authorized for pharmacy to dispense. The medication that needs refill is Sertraline. Please fu with patient.

## 2017-02-11 NOTE — Telephone Encounter (Signed)
Refilled

## 2017-05-21 ENCOUNTER — Telehealth (INDEPENDENT_AMBULATORY_CARE_PROVIDER_SITE_OTHER): Payer: Self-pay | Admitting: Family Medicine

## 2017-05-21 ENCOUNTER — Other Ambulatory Visit: Payer: Self-pay | Admitting: Nurse Practitioner

## 2017-05-21 DIAGNOSIS — F325 Major depressive disorder, single episode, in full remission: Secondary | ICD-10-CM

## 2017-05-21 DIAGNOSIS — Z760 Encounter for issue of repeat prescription: Secondary | ICD-10-CM

## 2017-05-21 MED ORDER — TRAZODONE HCL 50 MG PO TABS: 25.0000 mg | ORAL_TABLET | Freq: Every evening | ORAL | 0 refills | Status: DC | PRN

## 2017-05-21 MED ORDER — SERTRALINE HCL 100 MG PO TABS: 100.0000 mg | ORAL_TABLET | Freq: Every day | ORAL | 0 refills | Status: DC

## 2017-05-21 MED ORDER — THIAMINE HCL 100 MG PO TABS: 100.0000 mg | ORAL_TABLET | Freq: Every day | ORAL | 6 refills | Status: AC

## 2017-05-21 NOTE — Telephone Encounter (Signed)
Pt called to request his medications be refilled until his next appt. -thiamine 100 MG tablet  -traZODone (DESYREL) 50 MG tablet  -sertraline (ZOLOFT) 100 MG tablet  To [facility] [address] - [place], [place] - [address] Please follow up

## 2017-05-21 NOTE — Telephone Encounter (Signed)
PRESCRIPTION HAS BEEN SENT

## 2017-05-23 ENCOUNTER — Other Ambulatory Visit: Payer: Self-pay | Admitting: Nurse Practitioner

## 2017-05-23 DIAGNOSIS — F325 Major depressive disorder, single episode, in full remission: Secondary | ICD-10-CM

## 2017-06-17 ENCOUNTER — Ambulatory Visit (INDEPENDENT_AMBULATORY_CARE_PROVIDER_SITE_OTHER): Payer: Self-pay | Admitting: Nurse Practitioner

## 2017-06-17 ENCOUNTER — Other Ambulatory Visit: Payer: Self-pay

## 2017-06-17 ENCOUNTER — Encounter (INDEPENDENT_AMBULATORY_CARE_PROVIDER_SITE_OTHER): Payer: Self-pay | Admitting: Nurse Practitioner

## 2017-06-17 VITALS — BP 142/87 | HR 70 | Temp 98.1°F | Wt 168.6 lb

## 2017-06-17 DIAGNOSIS — G40909 Epilepsy, unspecified, not intractable, without status epilepticus: Secondary | ICD-10-CM

## 2017-06-17 DIAGNOSIS — D696 Thrombocytopenia, unspecified: Secondary | ICD-10-CM | POA: Insufficient documentation

## 2017-06-17 DIAGNOSIS — F10931 Alcohol use, unspecified with withdrawal delirium: Secondary | ICD-10-CM

## 2017-06-17 DIAGNOSIS — F10231 Alcohol dependence with withdrawal delirium: Secondary | ICD-10-CM

## 2017-06-17 DIAGNOSIS — J309 Allergic rhinitis, unspecified: Secondary | ICD-10-CM

## 2017-06-17 DIAGNOSIS — F325 Major depressive disorder, single episode, in full remission: Secondary | ICD-10-CM

## 2017-06-17 DIAGNOSIS — Z0000 Encounter for general adult medical examination without abnormal findings: Secondary | ICD-10-CM

## 2017-06-17 MED ORDER — FLUTICASONE PROPIONATE 50 MCG/ACT NA SUSP: 2.0000 | Freq: Every day | NASAL | 6 refills | Status: DC

## 2017-06-17 MED ORDER — CETIRIZINE HCL 10 MG PO TABS: 10.0000 mg | ORAL_TABLET | Freq: Every day | ORAL | 11 refills | Status: DC

## 2017-06-17 MED ORDER — TRAZODONE HCL 50 MG PO TABS: 50.0000 mg | ORAL_TABLET | Freq: Every evening | ORAL | 1 refills | Status: DC | PRN

## 2017-06-17 MED ORDER — SERTRALINE HCL 100 MG PO TABS: 100.0000 mg | ORAL_TABLET | Freq: Every day | ORAL | 0 refills | Status: DC

## 2017-06-17 NOTE — Patient Instructions (Signed)
Living With Depression  Everyone experiences occasional disappointment, sadness, and loss in their lives. When you are feeling down, blue, or sad for at least 2 weeks in a row, it may mean that you have depression. Depression can affect your thoughts and feelings, relationships, daily activities, and physical health. It is caused by changes in the way your brain functions. If you receive a diagnosis of depression, your health care provider will tell you which type of depression you have and what treatment options are available to you.  If you are living with depression, there are ways to help you recover from it and also ways to prevent it from coming back.  How to cope with lifestyle changes  Coping with stress  Stress is your body's reaction to life changes and events, both good and bad. Stressful situations may include:   Getting married.   The death of a spouse.   Losing a job.   Retiring.   Having a baby.    Stress can last just a few hours or it can be ongoing. Stress can play a major role in depression, so it is important to learn both how to cope with stress and how to think about it differently.  Talk with your health care provider or a counselor if you would like to learn more about stress reduction. He or she may suggest some stress reduction techniques, such as:   Music therapy. This can include creating music or listening to music. Choose music that you enjoy and that inspires you.   Mindfulness-based meditation. This kind of meditation can be done while sitting or walking. It involves being aware of your normal breaths, rather than trying to control your breathing.   Centering prayer. This is a kind of meditation that involves focusing on a spiritual word or phrase. Choose a word, phrase, or sacred image that is meaningful to you and that brings you peace.   Deep breathing. To do this, expand your stomach and inhale slowly through your nose. Hold your breath for 3-5 seconds, then exhale  slowly, allowing your stomach muscles to relax.   Muscle relaxation. This involves intentionally tensing muscles then relaxing them.    Choose a stress reduction technique that fits your lifestyle and personality. Stress reduction techniques take time and practice to develop. Set aside 5-15 minutes a day to do them. Therapists can offer training in these techniques. The training may be covered by some insurance plans. Other things you can do to manage stress include:   Keeping a stress diary. This can help you learn what triggers your stress and ways to control your response.   Understanding what your limits are and saying no to requests or events that lead to a schedule that is too full.   Thinking about how you respond to certain situations. You may not be able to control everything, but you can control how you react.   Adding humor to your life by watching funny films or TV shows.   Making time for activities that help you relax and not feeling guilty about spending your time this way.    Medicines  Your health care provider may suggest certain medicines if he or she feels that they will help improve your condition. Avoid using alcohol and other substances that may prevent your medicines from working properly (may interact). It is also important to:   Talk with your pharmacist or health care provider about all the medicines that you take, their possible side effects,   and what medicines are safe to take together.   Make it your goal to take part in all treatment decisions (shared decision-making). This includes giving input on the side effects of medicines. It is best if shared decision-making with your health care provider is part of your total treatment plan.    If your health care provider prescribes a medicine, you may not notice the full benefits of it for 4-8 weeks. Most people who are treated for depression need to be on medicine for at least 6-12 months after they feel better. If you are taking  medicines as part of your treatment, do not stop taking medicines without first talking to your health care provider. You may need to have the medicine slowly decreased (tapered) over time to decrease the risk of harmful side effects.  Relationships  Your health care provider may suggest family therapy along with individual therapy and drug therapy. While there may not be family problems that are causing you to feel depressed, it is still important to make sure your family learns as much as they can about your mental health. Having your family's support can help make your treatment successful.  How to recognize changes in your condition  Everyone has a different response to treatment for depression. Recovery from major depression happens when you have not had signs of major depression for two months. This may mean that you will start to:   Have more interest in doing activities.   Feel less hopeless than you did 2 months ago.   Have more energy.   Overeat less often, or have better or improving appetite.   Have better concentration.    Your health care provider will work with you to decide the next steps in your recovery. It is also important to recognize when your condition is getting worse. Watch for these signs:   Having fatigue or low energy.   Eating too much or too little.   Sleeping too much or too little.   Feeling restless, agitated, or hopeless.   Having trouble concentrating or making decisions.   Having unexplained physical complaints.   Feeling irritable, angry, or aggressive.    Get help as soon as you or your family members notice these symptoms coming back.  How to get support and help from others  How to talk with friends and family members about your condition  Talking to friends and family members about your condition can provide you with one way to get support and guidance. Reach out to trusted friends or family members, explain your symptoms to them, and let them know that you are  working with a health care provider to treat your depression.  Financial resources  Not all insurance plans cover mental health care, so it is important to check with your insurance carrier. If paying for co-pays or counseling services is a problem, search for a local or county mental health care center. They may be able to offer public mental health care services at low or no cost when you are not able to see a private health care provider.  If you are taking medicine for depression, you may be able to get the generic form, which may be less expensive. Some makers of prescription medicines also offer help to patients who cannot afford the medicines they need.  Follow these instructions at home:   Get the right amount and quality of sleep.   Cut down on using caffeine, tobacco, alcohol, and other potentially harmful substances.     Try to exercise, such as walking or lifting small weights.   Take over-the-counter and prescription medicines only as told by your health care provider.   Eat a healthy diet that includes plenty of vegetables, fruits, whole grains, low-fat dairy products, and lean protein. Do not eat a lot of foods that are high in solid fats, added sugars, or salt.   Keep all follow-up visits as told by your health care provider. This is important.  Contact a health care provider if:   You stop taking your antidepressant medicines, and you have any of these symptoms:  ? Nausea.  ? Headache.  ? Feeling lightheaded.  ? Chills and body aches.  ? Not being able to sleep (insomnia).   You or your friends and family think your depression is getting worse.  Get help right away if:   You have thoughts of hurting yourself or others.  If you ever feel like you may hurt yourself or others, or have thoughts about taking your own life, get help right away. You can go to your nearest emergency department or call:   Your local emergency services (911 in the [place]   A suicide crisis helpline, such as the  National Suicide Prevention Lifeline at 1-800-273-8255. This is open 24-hours a day.    Summary   If you are living with depression, there are ways to help you recover from it and also ways to prevent it from coming back.   Work with your health care team to create a management plan that includes counseling, stress management techniques, and healthy lifestyle habits.  This information is not intended to replace advice given to you by your health care provider. Make sure you discuss any questions you have with your health care provider.  Document Released: 12/30/2015 Document Revised: 12/30/2015 Document Reviewed: 12/30/2015  Elsevier Interactive Patient Education  2018 [employer]      Living With Anxiety  After being diagnosed with an anxiety disorder, you may be relieved to know why you have felt or behaved a certain way. It is natural to also feel overwhelmed about the treatment ahead and what it will mean for your life. With care and support, you can manage this condition and recover from it.  How to cope with anxiety  Dealing with stress  Stress is your body's reaction to life changes and events, both good and bad. Stress can last just a few hours or it can be ongoing. Stress can play a major role in anxiety, so it is important to learn both how to cope with stress and how to think about it differently.  Talk with your health care provider or a counselor to learn more about stress reduction. He or she may suggest some stress reduction techniques, such as:   Music therapy. This can include creating or listening to music that you enjoy and that inspires you.   Mindfulness-based meditation. This involves being aware of your normal breaths, rather than trying to control your breathing. It can be done while sitting or walking.   Centering prayer. This is a kind of meditation that involves focusing on a word, phrase, or sacred image that is meaningful to you and that brings you peace.   Deep breathing. To do  this, expand your stomach and inhale slowly through your nose. Hold your breath for 3-5 seconds. Then exhale slowly, allowing your stomach muscles to relax.   Self-talk. This is a skill where you identify thought patterns that lead to anxiety   reactions and correct those thoughts.   Muscle relaxation. This involves tensing muscles then relaxing them.    Choose a stress reduction technique that fits your lifestyle and personality. Stress reduction techniques take time and practice. Set aside 5-15 minutes a day to do them. Therapists can offer training in these techniques. The training may be covered by some insurance plans. Other things you can do to manage stress include:   Keeping a stress diary. This can help you learn what triggers your stress and ways to control your response.   Thinking about how you respond to certain situations. You may not be able to control everything, but you can control your reaction.   Making time for activities that help you relax, and not feeling guilty about spending your time in this way.    Therapy combined with coping and stress-reduction skills provides the best chance for successful treatment.  Medicines  Medicines can help ease symptoms. Medicines for anxiety include:   Anti-anxiety drugs.   Antidepressants.   Beta-blockers.    Medicines may be used as the main treatment for anxiety disorder, along with therapy, or if other treatments are not working. Medicines should be prescribed by a health care provider.  Relationships  Relationships can play a big part in helping you recover. Try to spend more time connecting with trusted friends and family members. Consider going to couples counseling, taking family education classes, or going to family therapy. Therapy can help you and others better understand the condition.  How to recognize changes in your condition  Everyone has a different response to treatment for anxiety. Recovery from anxiety happens when symptoms decrease  and stop interfering with your daily activities at home or work. This may mean that you will start to:   Have better concentration and focus.   Sleep better.   Be less irritable.   Have more energy.   Have improved memory.    It is important to recognize when your condition is getting worse. Contact your health care provider if your symptoms interfere with home or work and you do not feel like your condition is improving.  Where to find help and support:  You can get help and support from these sources:   Self-help groups.   Online and community organizations.   A trusted spiritual leader.   Couples counseling.   Family education classes.   Family therapy.    Follow these instructions at home:   Eat a healthy diet that includes plenty of vegetables, fruits, whole grains, low-fat dairy products, and lean protein. Do not eat a lot of foods that are high in solid fats, added sugars, or salt.   Exercise. Most adults should do the following:  ? Exercise for at least 150 minutes each week. The exercise should increase your heart rate and make you sweat (moderate-intensity exercise).  ? Strengthening exercises at least twice a week.   Cut down on caffeine, tobacco, alcohol, and other potentially harmful substances.   Get the right amount and quality of sleep. Most adults need 7-9 hours of sleep each night.   Make choices that simplify your life.   Take over-the-counter and prescription medicines only as told by your health care provider.   Avoid caffeine, alcohol, and certain over-the-counter cold medicines. These may make you feel worse. Ask your pharmacist which medicines to avoid.   Keep all follow-up visits as told by your health care provider. This is important.  Questions to ask your health care provider     Would I benefit from therapy?   How often should I follow up with a health care provider?   How long do I need to take medicine?   Are there any long-term side effects of my medicine?   Are  there any alternatives to taking medicine?  Contact a health care provider if:   You have a hard time staying focused or finishing daily tasks.   You spend many hours a day feeling worried about everyday life.   You become exhausted by worry.   You start to have headaches, feel tense, or have nausea.   You urinate more than normal.   You have diarrhea.  Get help right away if:   You have a racing heart and shortness of breath.   You have thoughts of hurting yourself or others.  If you ever feel like you may hurt yourself or others, or have thoughts about taking your own life, get help right away. You can go to your nearest emergency department or call:   Your local emergency services (911 in the [place]   A suicide crisis helpline, such as the National Suicide Prevention Lifeline at 1-800-273-8255. This is open 24-hours a day.    Summary   Taking steps to deal with stress can help calm you.   Medicines cannot cure anxiety disorders, but they can help ease symptoms.   Family, friends, and partners can play a big part in helping you recover from an anxiety disorder.  This information is not intended to replace advice given to you by your health care provider. Make sure you discuss any questions you have with your health care provider.  Document Released: 01/21/2016 Document Revised: 01/21/2016 Document Reviewed: 01/21/2016  Elsevier Interactive Patient Education  2018 [employer]    Insomnia  Insomnia is a sleep disorder that makes it difficult to fall asleep or to stay asleep. Insomnia can cause tiredness (fatigue), low energy, difficulty concentrating, mood swings, and poor performance at work or school.  There are three different ways to classify insomnia:   Difficulty falling asleep.   Difficulty staying asleep.   Waking up too early in the morning.    Any type of insomnia can be long-term (chronic) or short-term (acute). Both are common. Short-term insomnia usually lasts for three months or  less. Chronic insomnia occurs at least three times a week for longer than three months.  What are the causes?  Insomnia may be caused by another condition, situation, or substance, such as:   Anxiety.   Certain medicines.   Gastroesophageal reflux disease (GERD) or other gastrointestinal conditions.   Asthma or other breathing conditions.   Restless legs syndrome, sleep apnea, or other sleep disorders.   Chronic pain.   Menopause. This may include hot flashes.   Stroke.   Abuse of alcohol, tobacco, or illegal drugs.   Depression.   Caffeine.   Neurological disorders, such as Alzheimer disease.   An overactive thyroid (hyperthyroidism).    The cause of insomnia may not be known.  What increases the risk?  Risk factors for insomnia include:   Gender. Women are more commonly affected than men.   Age. Insomnia is more common as you get older.   Stress. This may involve your professional or personal life.   Income. Insomnia is more common in people with lower income.   Lack of exercise.   Irregular work schedule or night shifts.   Traveling between different time zones.    What are the signs   or symptoms?  If you have insomnia, trouble falling asleep or trouble staying asleep is the main symptom. This may lead to other symptoms, such as:   Feeling fatigued.   Feeling nervous about going to sleep.   Not feeling rested in the morning.   Having trouble concentrating.   Feeling irritable, anxious, or depressed.    How is this treated?  Treatment for insomnia depends on the cause. If your insomnia is caused by an underlying condition, treatment will focus on addressing the condition. Treatment may also include:   Medicines to help you sleep.   Counseling or therapy.   Lifestyle adjustments.    Follow these instructions at home:   Take medicines only as directed by your health care provider.   Keep regular sleeping and waking hours. Avoid naps.   Keep a sleep diary to help you and your health care  provider figure out what could be causing your insomnia. Include:  ? When you sleep.  ? When you wake up during the night.  ? How well you sleep.  ? How rested you feel the next day.  ? Any side effects of medicines you are taking.  ? What you eat and drink.   Make your bedroom a comfortable place where it is easy to fall asleep:  ? Put up shades or special blackout curtains to block light from outside.  ? Use a white noise machine to block noise.  ? Keep the temperature cool.   Exercise regularly as directed by your health care provider. Avoid exercising right before bedtime.   Use relaxation techniques to manage stress. Ask your health care provider to suggest some techniques that may work well for you. These may include:  ? Breathing exercises.  ? Routines to release muscle tension.  ? Visualizing peaceful scenes.   Cut back on alcohol, caffeinated beverages, and cigarettes, especially close to bedtime. These can disrupt your sleep.   Do not overeat or eat spicy foods right before bedtime. This can lead to digestive discomfort that can make it hard for you to sleep.   Limit screen use before bedtime. This includes:  ? Watching TV.  ? Using your smartphone, tablet, and computer.   Stick to a routine. This can help you fall asleep faster. Try to do a quiet activity, brush your teeth, and go to bed at the same time each night.   Get out of bed if you are still awake after 15 minutes of trying to sleep. Keep the lights down, but try reading or doing a quiet activity. When you feel sleepy, go back to bed.   Make sure that you drive carefully. Avoid driving if you feel very sleepy.   Keep all follow-up appointments as directed by your health care provider. This is important.  Contact a health care provider if:   You are tired throughout the day or have trouble in your daily routine due to sleepiness.   You continue to have sleep problems or your sleep problems get worse.  Get help right away if:   You have  serious thoughts about hurting yourself or someone else.  This information is not intended to replace advice given to you by your health care provider. Make sure you discuss any questions you have with your health care provider.  Document Released: 01/24/2000 Document Revised: 06/28/2015 Document Reviewed: 10/27/2013  [employer]vier Interactive Patient Education  2018 [employer]

## 2017-06-17 NOTE — Progress Notes (Signed)
Assessment & Plan:  Timothy [name] was seen today for new patient (initial visit) and effects of fexafenidine.  Diagnoses and all orders for this visit:  Major depressive disorder with single episode, in remission ([place]) -     sertraline (ZOLOFT) 100 MG tablet; Take 1 tablet (100 mg total) by mouth daily. -     traZODone (DESYREL) 50 MG tablet; Take 1 tablet (50 mg total) by mouth at bedtime as needed for sleep.  Allergic rhinitis, unspecified seasonality, unspecified trigger -     cetirizine (ZYRTEC) 10 MG tablet; Take 1 tablet (10 mg total) by mouth daily. -     fluticasone (FLONASE) 50 MCG/ACT nasal spray; Place 2 sprays into both nostrils daily.  Routine health maintenance -     CBC -     CMP14+EGFR -     Lipid panel  Delirium tremens ([place]) RESOLVED  Seizure disorder ([place]) RESOLVED   Thrombocytopenia ([place]) CBC PENDING    Patient has been counseled on age-appropriate routine health concerns for screening and prevention. These are reviewed and up-to-date. Referrals have been placed accordingly. Immunizations are up-to-date or declined.    Subjective:   Chief Complaint  Patient presents with  . New Patient (Initial Visit)    medication refills   . effects of fexafenidine   HPI Timothy [name] 42 y.o. male presents to office today to establish care. He has a history of ETOH abuse and major depressive disorder. He currently takes zoloft and trazodone for depression and insomnia. He does not see a therapist.    Insomnia He takes trazodone for insomnia (difficulty falling asleep) which provides significant relief of symptoms.     Depression and Anxiety  Taking zoloft which provides significant relief of his symptoms. Taking half a tablet in the morning and sometimes the other half in the evenings. Denies suicidal ideation.  Depression screen PHQ 2/9 06/17/2017  Decreased Interest 1  Down, Depressed, Hopeless 1  PHQ - 2 Score 2  Altered sleeping 0  Tired, decreased  energy 1  Change in appetite 0  Feeling bad or failure about yourself  0  Trouble concentrating 0  Moving slowly or fidgety/restless 0  Suicidal thoughts 0  PHQ-9 Score 3    Allergic Rhinitis He is currently taking allegra which he states does not provide relief of his symptoms. Endorses significant nasal congestion. He has tried claritin which was ineffective. Will try zyrtec and flonase today.    Review of Systems  Constitutional: Negative for fever, malaise/fatigue and weight loss.  HENT: Positive for congestion. Negative for nosebleeds.   Eyes: Negative.  Negative for blurred vision, double vision and photophobia.  Respiratory: Negative.  Negative for cough and shortness of breath.   Cardiovascular: Negative.  Negative for chest pain, palpitations and leg swelling.  Gastrointestinal: Negative.  Negative for heartburn, nausea and vomiting.  Musculoskeletal: Negative.  Negative for myalgias.  Neurological: Negative.  Negative for dizziness, focal weakness, seizures and headaches.  Endo/Heme/Allergies: Positive for environmental allergies.  Psychiatric/Behavioral: Positive for depression. Negative for suicidal ideas. The patient is nervous/anxious and has insomnia.     Past Medical History:  Diagnosis Date  . Depression   . ETOH abuse   . Seizure due to alcohol withdrawal, uncomplicated ([place])    2725    Past Surgical History:  Procedure Laterality Date  . NO PAST SURGERIES      Family History  Problem Relation Age of Onset  . Arthritis Father  rheumatoid  . High blood pressure Father   . Alcoholism Paternal Uncle   . Alcoholism Maternal Uncle     Social History Reviewed with no changes to be made today.   Outpatient Medications Prior to Visit  Medication Sig Dispense Refill  . acetaminophen (TYLENOL) 500 MG tablet Take 1,000 mg by mouth every 6 (six) hours as needed.    . Multiple Vitamins-Minerals (MULTIVITAMIN) tablet Take 1 tablet daily by mouth. 30  tablet 6  . sertraline (ZOLOFT) 100 MG tablet Take 1 tablet (100 mg total) by mouth daily. 30 tablet 0  . traZODone (DESYREL) 50 MG tablet Take 0.5-1 tablets (25-50 mg total) by mouth at bedtime as needed for sleep. 30 tablet 0  . thiamine 100 MG tablet Take 1 tablet (100 mg total) by mouth daily. (Patient not taking: Reported on 06/17/2017) 30 tablet 6  . fexofenadine (ALLEGRA ALLERGY) 180 MG tablet Take 1 tablet (180 mg total) daily by mouth. (Patient not taking: Reported on 06/17/2017) 30 tablet 11   No facility-administered medications prior to visit.     No Known Allergies     Objective:    BP (!) 142/87 (BP Location: Right Arm, Patient Position: Sitting, Cuff Size: Normal)   Pulse 70   Temp 98.1 F (36.7 C) (Oral)   Wt 168 lb 9.6 oz (76.5 kg)   SpO2 97%   BMI 27.21 kg/m  Wt Readings from Last 3 Encounters:  06/17/17 168 lb 9.6 oz (76.5 kg)  12/25/16 170 lb 9.6 oz (77.4 kg)  07/23/16 169 lb (76.7 kg)    Physical Exam  Constitutional: He is oriented to person, place, and time. He appears well-developed and well-nourished. He is cooperative.  HENT:  Head: Normocephalic and atraumatic.  Nose: Mucosal edema and rhinorrhea present.  Eyes: EOM are normal.  Neck: Normal range of motion.  Cardiovascular: Normal rate, regular rhythm and normal heart sounds. Exam reveals no gallop and no friction rub.  No murmur heard. Pulmonary/Chest: Effort normal and breath sounds normal. No tachypnea. No respiratory distress. He has no decreased breath sounds. He has no wheezes. He has no rhonchi. He has no rales. He exhibits no tenderness.  Abdominal: Soft. Bowel sounds are normal.  Musculoskeletal: Normal range of motion. He exhibits no edema.  Neurological: He is alert and oriented to person, place, and time. Coordination normal.  Skin: Skin is warm and dry.  Psychiatric: He has a normal mood and affect. His behavior is normal. Judgment and thought content normal.  Nursing note and vitals  reviewed.      Patient has been counseled extensively about nutrition and exercise as well as the importance of adherence with medications and regular follow-up. The patient was given clear instructions to go to ER or return to medical center if symptoms don't improve, worsen or new problems develop. The patient verbalized understanding.   Follow-up: Return in about 6 months (around 12/18/2017).   Gildardo Pounds, FNP-BC [facility] and [place], [place]   06/17/2017, 5:26 PM

## 2017-06-18 LAB — CMP14+EGFR
ALT: 114 IU/L — ABNORMAL HIGH (ref 0–44)
AST: 96 IU/L — ABNORMAL HIGH (ref 0–40)
Albumin/Globulin Ratio: 1.7 (ref 1.2–2.2)
Albumin: 4.8 g/dL (ref 3.5–5.5)
Alkaline Phosphatase: 112 IU/L (ref 39–117)
BUN/Creatinine Ratio: 12 (ref 9–20)
BUN: 10 mg/dL (ref 6–20)
Bilirubin Total: 0.4 mg/dL (ref 0.0–1.2)
CO2: 25 mmol/L (ref 20–29)
Calcium: 10 mg/dL (ref 8.7–10.2)
Chloride: 102 mmol/L (ref 96–106)
Creatinine, Ser: 0.85 mg/dL (ref 0.76–1.27)
GFR calc Af Amer: 127 mL/min/1.73
GFR calc non Af Amer: 110 mL/min/1.73
Globulin, Total: 2.8 g/dL (ref 1.5–4.5)
Glucose: 99 mg/dL (ref 65–99)
Potassium: 4.2 mmol/L (ref 3.5–5.2)
Sodium: 142 mmol/L (ref 134–144)
Total Protein: 7.6 g/dL (ref 6.0–8.5)

## 2017-06-18 LAB — CBC
Hematocrit: 40.5 % (ref 37.5–51.0)
Hemoglobin: 14 g/dL (ref 13.0–17.7)
MCH: 33.2 pg — ABNORMAL HIGH (ref 26.6–33.0)
MCHC: 34.6 g/dL (ref 31.5–35.7)
MCV: 96 fL (ref 79–97)
Platelets: 164 x10E3/uL (ref 150–379)
RBC: 4.22 x10E6/uL (ref 4.14–5.80)
RDW: 13.1 % (ref 12.3–15.4)
WBC: 7.1 x10E3/uL (ref 3.4–10.8)

## 2017-06-18 LAB — LIPID PANEL
Chol/HDL Ratio: 4.2 ratio (ref 0.0–5.0)
Cholesterol, Total: 203 mg/dL — ABNORMAL HIGH (ref 100–199)
HDL: 48 mg/dL
LDL Calculated: 118 mg/dL — ABNORMAL HIGH (ref 0–99)
Triglycerides: 187 mg/dL — ABNORMAL HIGH (ref 0–149)
VLDL Cholesterol Cal: 37 mg/dL (ref 5–40)

## 2017-11-09 IMAGING — US US ABDOMEN LIMITED
1 series · 14 of 25 positions shown · non-contrast
Comparison: None.

CLINICAL DATA: 40-year-old male with transaminitis. History of ETOH
abuse.

EXAM:
US ABDOMEN LIMITED - RIGHT UPPER QUADRANT

[Series 1: us abdomen limited · 0.20mm/px · 14 of 45 slices shown]
[im 1/45]
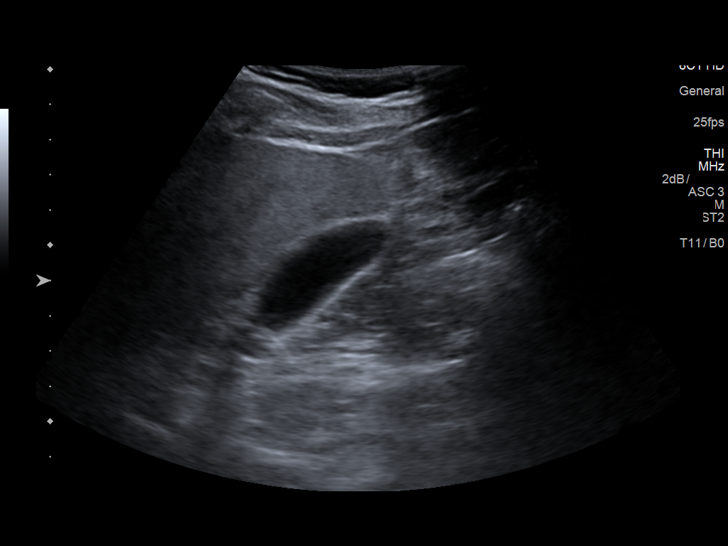
[im 4/45]
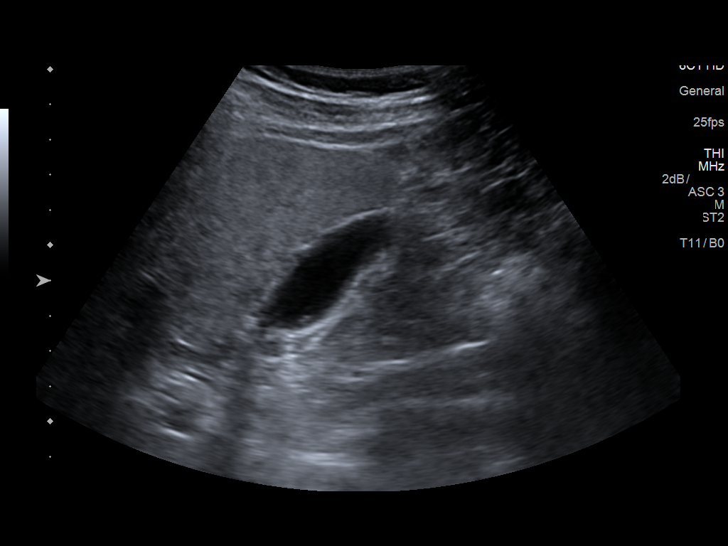
[im 8/45]
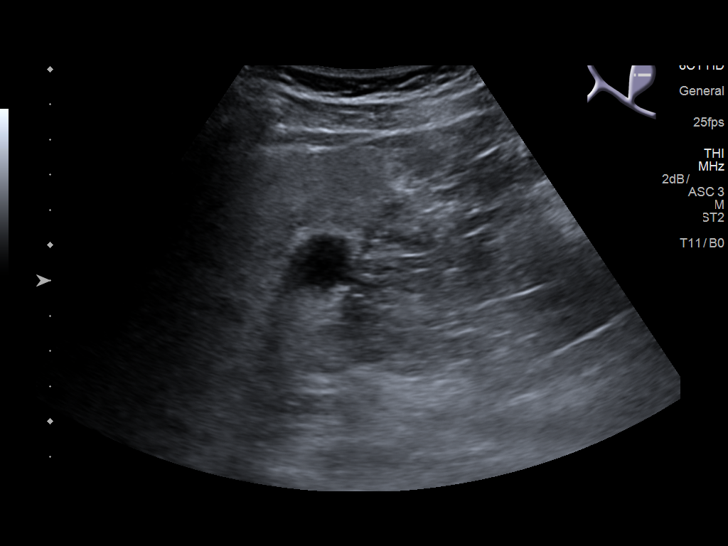
[im 12/45]
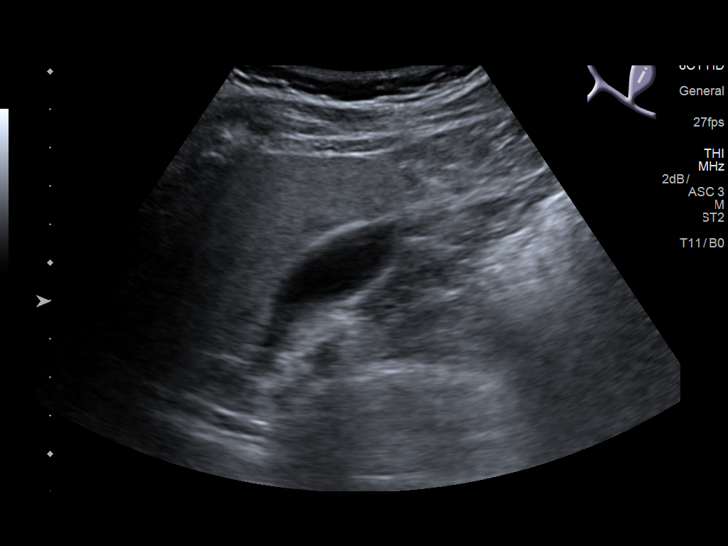
[im 15/45]
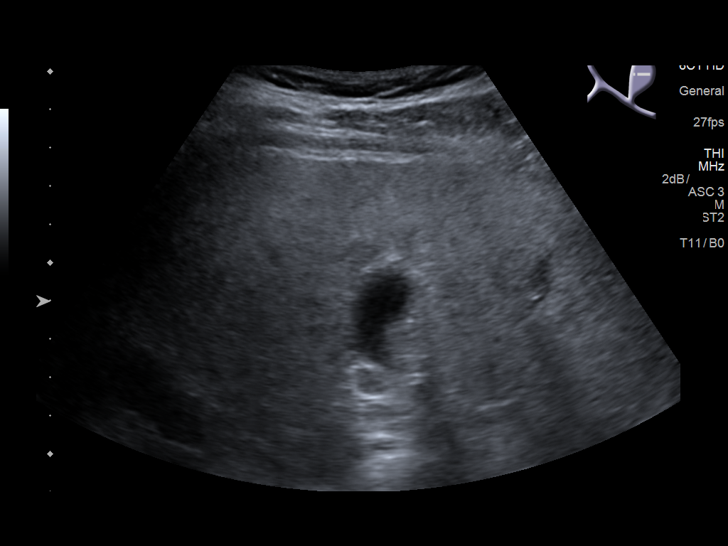
[im 17/45]
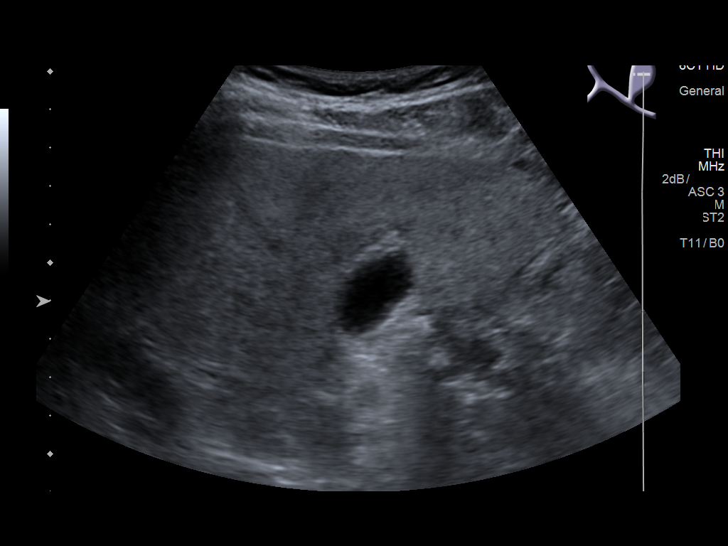
[im 21/45]
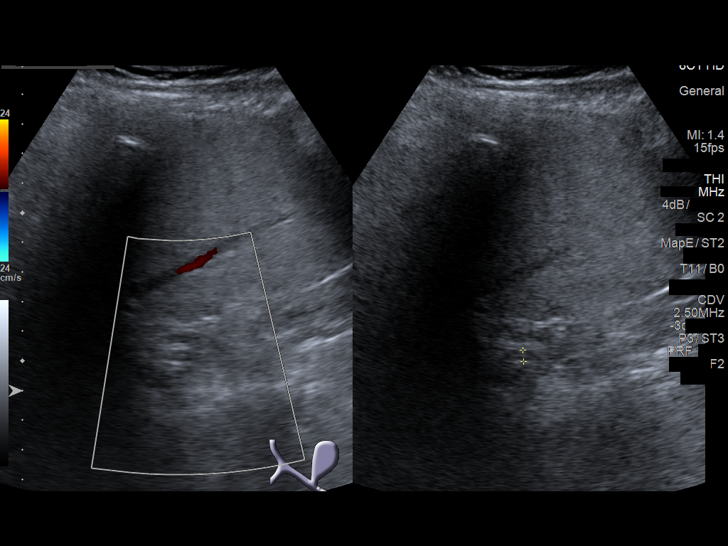
[im 24/45]
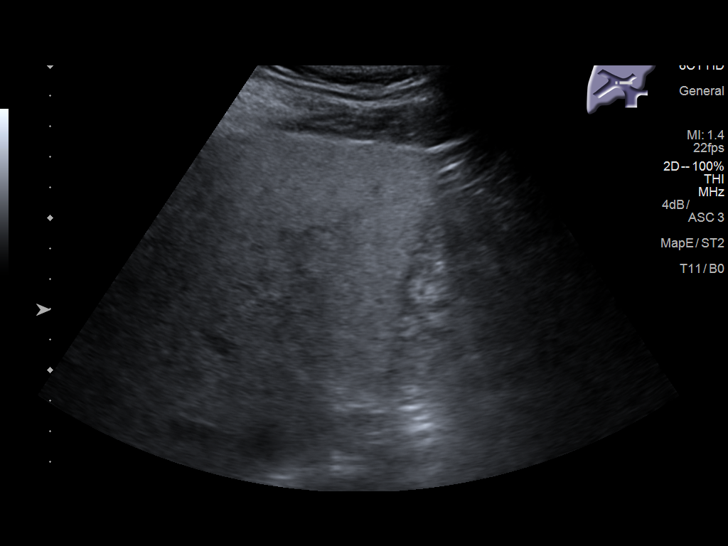
[im 28/45]
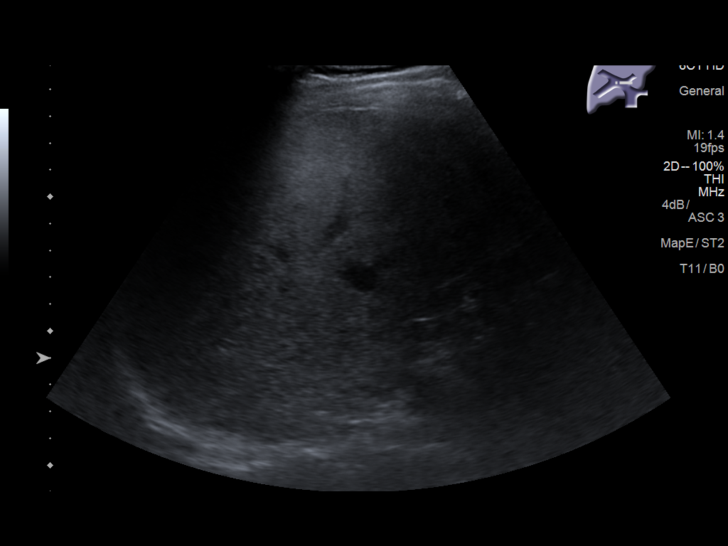
[im 30/45]
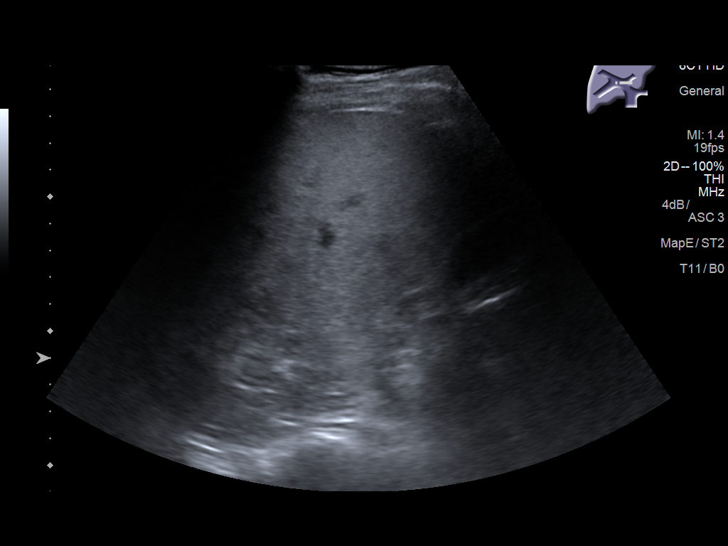
[im 34/45]
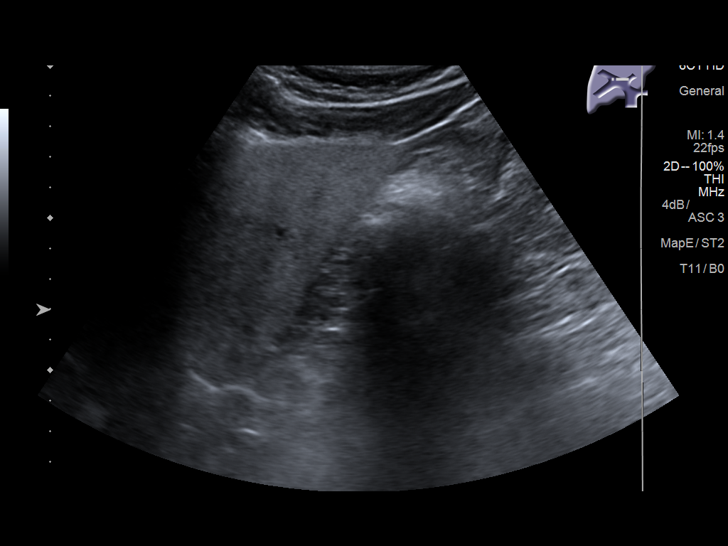
[im 37/45]
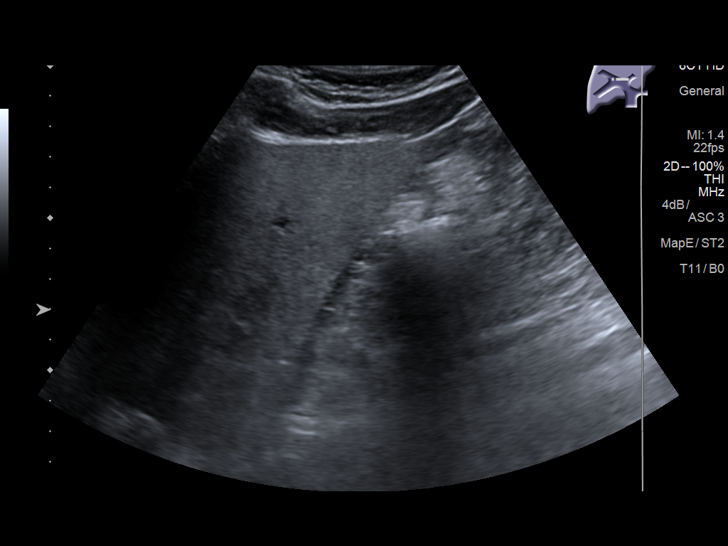
[im 41/45]
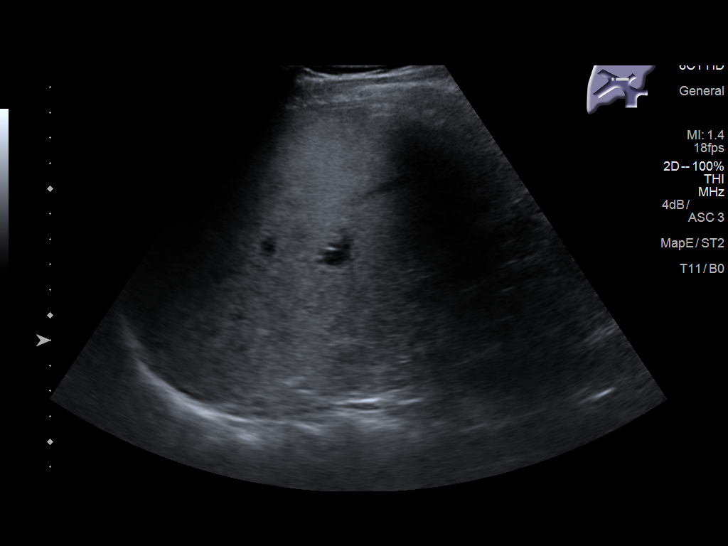
[im 45/45]
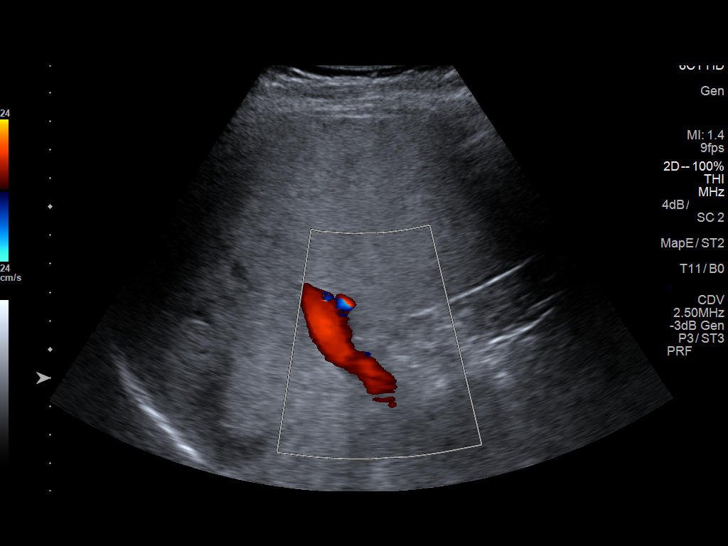

[14 of 25 positions shown; findings below may reference images not displayed]

FINDINGS: Gallbladder:

No gallstones or wall thickening visualized. No sonographic Murphy
sign noted by sonographer.

Common bile duct:

Diameter: 4 mm

Liver:

Diffuse increased hepatic echotexture compatible with fatty
infiltration.
IMPRESSION: Fatty liver.

No gallstones.

## 2017-11-19 ENCOUNTER — Other Ambulatory Visit (INDEPENDENT_AMBULATORY_CARE_PROVIDER_SITE_OTHER): Payer: Self-pay | Admitting: Physician Assistant

## 2017-11-19 ENCOUNTER — Telehealth (INDEPENDENT_AMBULATORY_CARE_PROVIDER_SITE_OTHER): Payer: Self-pay

## 2017-11-19 DIAGNOSIS — F325 Major depressive disorder, single episode, in full remission: Secondary | ICD-10-CM

## 2017-11-19 MED ORDER — SERTRALINE HCL 100 MG PO TABS: 100.0000 mg | ORAL_TABLET | Freq: Every day | ORAL | 0 refills | Status: DC

## 2017-11-19 NOTE — Telephone Encounter (Signed)
Refill sent.

## 2017-11-19 NOTE — Telephone Encounter (Signed)
Patient was seen by covering NP in may and was prescribed Sertraline. Patient only has seven pills left and would like to know if he can get a refill until his next OV on 11/14. Please advise. Maryjean Morn, CMA

## 2017-12-23 ENCOUNTER — Encounter (INDEPENDENT_AMBULATORY_CARE_PROVIDER_SITE_OTHER): Payer: Self-pay | Admitting: Physician Assistant

## 2017-12-23 ENCOUNTER — Ambulatory Visit (INDEPENDENT_AMBULATORY_CARE_PROVIDER_SITE_OTHER): Payer: Self-pay | Admitting: Physician Assistant

## 2017-12-23 ENCOUNTER — Other Ambulatory Visit: Payer: Self-pay

## 2017-12-23 VITALS — BP 142/90 | HR 74 | Temp 98.1°F | Ht 66.0 in | Wt 174.2 lb

## 2017-12-23 DIAGNOSIS — R748 Abnormal levels of other serum enzymes: Secondary | ICD-10-CM

## 2017-12-23 DIAGNOSIS — F325 Major depressive disorder, single episode, in full remission: Secondary | ICD-10-CM

## 2017-12-23 DIAGNOSIS — E7841 Elevated Lipoprotein(a): Secondary | ICD-10-CM

## 2017-12-23 DIAGNOSIS — Z23 Encounter for immunization: Secondary | ICD-10-CM

## 2017-12-23 DIAGNOSIS — G4700 Insomnia, unspecified: Secondary | ICD-10-CM

## 2017-12-23 MED ORDER — MELATONIN 5 MG PO TABS: 1.0000 | ORAL_TABLET | Freq: Every day | ORAL | 2 refills | Status: DC

## 2017-12-23 MED ORDER — TRAZODONE HCL 50 MG PO TABS: 50.0000 mg | ORAL_TABLET | Freq: Every day | ORAL | 3 refills | Status: DC

## 2017-12-23 MED ORDER — SERTRALINE HCL 100 MG PO TABS: 100.0000 mg | ORAL_TABLET | Freq: Every day | ORAL | 3 refills | Status: DC

## 2017-12-23 NOTE — Patient Instructions (Signed)

## 2017-12-23 NOTE — Progress Notes (Signed)
Subjective:  Patient ID: Timothy [name], male    DOB: [birth year]  Age: 42 y.o. MRN: [id number]  CC: f/u MDD  HPI Timothy [name] is a 43 y.o. male with a medical history of depression, ETOH abuse, and myopia presents to f/u on MDD and insomnia. Depression attributed to seizures 2/2 alcohol withdrawal and dealing with his wife's unfortunate diagnosis of breast cancer. Has been taking Sertraline 100 mg and Trazodone 50 mg with significant resolution of depression and insomnia. Also says his wife has done well with breast cancer treatment. Does not endorse symptoms of serotonin syndrome. Feels generally well and is able to resist stressors at work much better. Has abstained from alcohol and has not had any seizures since he was hospitalized. Not currently taking any anti-epileptics.          Outpatient Medications Prior to Visit  Medication Sig Dispense Refill  . acetaminophen (TYLENOL) 500 MG tablet Take 1,000 mg by mouth every 6 (six) hours as needed.    . Multiple Vitamins-Minerals (MULTIVITAMIN) tablet Take 1 tablet daily by mouth. 30 tablet 6  . sertraline (ZOLOFT) 100 MG tablet Take 1 tablet (100 mg total) by mouth daily. 45 tablet 0  . thiamine 100 MG tablet Take 1 tablet (100 mg total) by mouth daily. 30 tablet 6  . traZODone (DESYREL) 50 MG tablet Take 50 mg by mouth daily.  0  . cetirizine (ZYRTEC) 10 MG tablet Take 1 tablet (10 mg total) by mouth daily. 30 tablet 11  . fluticasone (FLONASE) 50 MCG/ACT nasal spray Place 2 sprays into both nostrils daily. 16 g 6  . traZODone (DESYREL) 50 MG tablet Take 1 tablet (50 mg total) by mouth at bedtime as needed for sleep. 90 tablet 1   No facility-administered medications prior to visit.      ROS Review of Systems  Constitutional: Negative for chills, fever and malaise/fatigue.  Eyes: Negative for blurred vision.  Respiratory: Negative for shortness of breath.   Cardiovascular: Negative for chest pain and  palpitations.  Gastrointestinal: Negative for abdominal pain and nausea.  Genitourinary: Negative for dysuria and hematuria.  Musculoskeletal: Negative for joint pain and myalgias.  Skin: Negative for rash.  Neurological: Negative for tingling and headaches.  Psychiatric/Behavioral: Negative for depression. The patient is not nervous/anxious.     Objective:  BP (!) 142/90 (BP Location: Left Arm, Patient Position: Sitting, Cuff Size: Normal)   Pulse 74   Temp 98.1 F (36.7 C) (Oral)   Ht 5\' 6"  (1.676 m)   Wt 174 lb 3.2 oz (79 kg)   SpO2 96%   BMI 28.12 kg/m   BP/Weight 12/23/2017 06/17/2017 12/25/2016  Systolic BP 142 142 131  Diastolic BP 90 87 83  Wt. (Lbs) 174.2 168.6 170.6  BMI 28.12 27.21 27.54      Physical Exam  Constitutional: He is oriented to person, place, and time.  Well developed, well nourished, NAD, polite  HENT:  Head: Normocephalic and atraumatic.  Eyes: No scleral icterus.  Neck: Normal range of motion. Neck supple. No thyromegaly present.  Cardiovascular: Normal rate, regular rhythm and normal heart sounds.  Pulmonary/Chest: Effort normal and breath sounds normal.  Musculoskeletal: He exhibits no edema.  Neurological: He is alert and oriented to person, place, and time.  Skin: Skin is warm and dry. No rash noted. No erythema. No pallor.  Psychiatric: He has a normal mood and affect. His behavior is normal. Thought content normal.  Vitals reviewed.    Assessment &  Plan:    1. Major depressive disorder with single episode, in remission ([facility]) - Refill sertraline (ZOLOFT) 100 MG tablet; Take 1 tablet (100 mg total) by mouth daily.  Dispense: 30 tablet; Refill: 3 - Pt not ready to down titrate Sertraline as of yet.   2. Insomnia, unspecified type - Refill raZODone (DESYREL) 50 MG tablet; Take 1 tablet (50 mg total) by mouth daily.  Dispense: 30 tablet; Refill: 3 - Begin Melatonin 5 MG TABS; Take 1 tablet (5 mg total) by mouth daily.  Dispense: 30  tablet; Refill: 2 - I have described serotonin syndrome to patient and he denies symptoms. I warned him about the risk and told him he should take Melatonin as alternative. He has thus far benefited from Trazodone in regards to insomnia but a safer route was advised. Pt understood and will try Melatonin. If Melatonin unsuccessful, he will take Trazodone again.   3. Need for Tdap vaccination - Tdap vaccine greater than or equal to 7yo IM  4. Need for prophylactic vaccination and inoculation against influenza - Flu Vaccine QUAD 6+ mos PF IM (Fluarix Quad PF)  5. Elevated lipoprotein(a) - Lipid panel  6. Elevated liver enzymes - Comprehensive metabolic panel   Meds ordered this encounter  Medications  . sertraline (ZOLOFT) 100 MG tablet    Sig: Take 1 tablet (100 mg total) by mouth daily.    Dispense:  30 tablet    Refill:  3    Order Specific Question:   Supervising Provider    Answer:   Hoy RegisterNEWLIN, ENOBONG [[id number]]  . traZODone (DESYREL) 50 MG tablet    Sig: Take 1 tablet (50 mg total) by mouth daily.    Dispense:  30 tablet    Refill:  3    Order Specific Question:   Supervising Provider    Answer:   Hoy RegisterNEWLIN, ENOBONG [[id number]]  . Melatonin 5 MG TABS    Sig: Take 1 tablet (5 mg total) by mouth daily.    Dispense:  30 tablet    Refill:  2    Order Specific Question:   Supervising Provider    Answer:   Hoy RegisterNEWLIN, ENOBONG [[id number]]    Follow-up: Return in about 3 months (around 03/25/2018) for MDD.   Loletta Specteroger David Porcia Morganti PA

## 2018-03-24 ENCOUNTER — Ambulatory Visit (INDEPENDENT_AMBULATORY_CARE_PROVIDER_SITE_OTHER): Payer: Self-pay | Admitting: Primary Care

## 2018-07-27 ENCOUNTER — Other Ambulatory Visit: Payer: Self-pay

## 2018-07-27 ENCOUNTER — Encounter (INDEPENDENT_AMBULATORY_CARE_PROVIDER_SITE_OTHER): Payer: Self-pay | Admitting: Primary Care

## 2018-07-27 ENCOUNTER — Ambulatory Visit (INDEPENDENT_AMBULATORY_CARE_PROVIDER_SITE_OTHER): Payer: Self-pay | Admitting: Primary Care

## 2018-07-27 DIAGNOSIS — Z760 Encounter for issue of repeat prescription: Secondary | ICD-10-CM

## 2018-07-27 DIAGNOSIS — F32A Depression, unspecified: Secondary | ICD-10-CM | POA: Insufficient documentation

## 2018-07-27 DIAGNOSIS — F325 Major depressive disorder, single episode, in full remission: Secondary | ICD-10-CM

## 2018-07-27 DIAGNOSIS — F5101 Primary insomnia: Secondary | ICD-10-CM

## 2018-07-27 DIAGNOSIS — G4700 Insomnia, unspecified: Secondary | ICD-10-CM | POA: Insufficient documentation

## 2018-07-27 DIAGNOSIS — F321 Major depressive disorder, single episode, moderate: Secondary | ICD-10-CM

## 2018-07-27 DIAGNOSIS — F329 Major depressive disorder, single episode, unspecified: Secondary | ICD-10-CM | POA: Insufficient documentation

## 2018-07-27 DIAGNOSIS — R030 Elevated blood-pressure reading, without diagnosis of hypertension: Secondary | ICD-10-CM

## 2018-07-27 MED ORDER — TRAZODONE HCL 50 MG PO TABS: 50.0000 mg | ORAL_TABLET | Freq: Every day | ORAL | 3 refills | Status: DC

## 2018-07-27 MED ORDER — SERTRALINE HCL 100 MG PO TABS: 100.0000 mg | ORAL_TABLET | Freq: Every day | ORAL | 3 refills | Status: DC

## 2018-07-27 NOTE — Progress Notes (Signed)
Virtual Visit via Telephone Note  I connected with Timothy [name] on 07/27/18 at  3:10 PM EDT by telephone and verified that I am speaking with the correct person using two identifiers.   I discussed the limitations, risks, security and privacy concerns of performing an evaluation and management service by telephone and the availability of in person appointments. I also discussed with the patient that there may be a patient responsible charge related to this service. The patient expressed understanding and agreed to proceed.   History of Present Illness: Timothy [name] is having a telemetry visit today requesting medication refills.  Reviewing previous notes we will follow-up on elevated blood pressure without a diagnosis.  Requesting refills for antidepressants and insomnia medication to follow-up in 4 weeks.   Observations/Objective: Review of systems 12 point all negative  Assessment and Plan: Timothy [name] was seen today for medication refill.  Diagnoses and all orders for this visit:  Medication refill Per patient request Zoloft and trazodone sent to pharmacy electronically pharmacy listed on chart  Current moderate episode of major depressive disorder, unspecified whether recurrent [facility]) Patient feels depression is stable/controlled on current dose of antidepressant medication. -     sertraline (ZOLOFT) 100 MG tablet; Take 1 tablet (100 mg total) by mouth daily.  Insomnia, unspecified type Trazodone continues to be effective with insomnia verbalizes no side effects -     traZODone (DESYREL) 50 MG tablet; Take 1 tablet (50 mg total) by mouth daily.  Elevated blood-pressure reading, without diagnosis of hypertension Will evaluate on follow-up at that time determine if hypertensive medication is warranted.    Follow Up Instructions:    I discussed the assessment and treatment plan with the patient. The patient was provided an opportunity to ask questions and all were answered.  The patient agreed with the plan and demonstrated an understanding of the instructions.   The patient was advised to call back or seek an in-person evaluation if the symptoms worsen or if the condition fails to improve as anticipated.  I provided 18 minutes of non-face-to-face time during this encounter.  This includes chart review.   Kerin Perna, NP

## 2018-08-22 ENCOUNTER — Other Ambulatory Visit (INDEPENDENT_AMBULATORY_CARE_PROVIDER_SITE_OTHER): Payer: Self-pay | Admitting: Nurse Practitioner

## 2018-08-22 DIAGNOSIS — J309 Allergic rhinitis, unspecified: Secondary | ICD-10-CM

## 2018-08-26 ENCOUNTER — Ambulatory Visit (INDEPENDENT_AMBULATORY_CARE_PROVIDER_SITE_OTHER): Payer: Self-pay | Admitting: Primary Care

## 2018-09-09 ENCOUNTER — Encounter (INDEPENDENT_AMBULATORY_CARE_PROVIDER_SITE_OTHER): Payer: Self-pay | Admitting: Primary Care

## 2018-09-09 ENCOUNTER — Other Ambulatory Visit: Payer: Self-pay

## 2018-09-09 ENCOUNTER — Ambulatory Visit (INDEPENDENT_AMBULATORY_CARE_PROVIDER_SITE_OTHER): Payer: Self-pay | Admitting: Primary Care

## 2018-09-09 VITALS — BP 135/87 | HR 93 | Temp 98.2°F | Ht 66.0 in | Wt 162.8 lb

## 2018-09-09 DIAGNOSIS — F321 Major depressive disorder, single episode, moderate: Secondary | ICD-10-CM

## 2018-09-09 DIAGNOSIS — G4700 Insomnia, unspecified: Secondary | ICD-10-CM

## 2018-09-09 DIAGNOSIS — Z760 Encounter for issue of repeat prescription: Secondary | ICD-10-CM

## 2018-09-09 DIAGNOSIS — Z79899 Other long term (current) drug therapy: Secondary | ICD-10-CM

## 2018-09-09 DIAGNOSIS — F325 Major depressive disorder, single episode, in full remission: Secondary | ICD-10-CM

## 2018-09-09 MED ORDER — SERTRALINE HCL 100 MG PO TABS: 100.0000 mg | ORAL_TABLET | Freq: Every day | ORAL | 3 refills | Status: DC

## 2018-09-09 MED ORDER — HYDROCHLOROTHIAZIDE 12.5 MG PO CAPS: 12.5000 mg | ORAL_CAPSULE | Freq: Every day | ORAL | 3 refills | Status: DC

## 2018-09-09 MED ORDER — TRAZODONE HCL 50 MG PO TABS: 50.0000 mg | ORAL_TABLET | Freq: Every day | ORAL | 3 refills | Status: DC

## 2018-09-09 MED ORDER — MELATONIN 5 MG PO TABS: 1.0000 | ORAL_TABLET | Freq: Every day | ORAL | 2 refills | Status: DC

## 2018-09-09 NOTE — Progress Notes (Signed)
Established Patient Office Visit  Subjective:  Patient ID: Timothy [name], male    DOB: Aug 15, 1975  Age: 43 y.o. MRN: [id number]  CC:  Chief Complaint  Patient presents with  . Follow-up    anxiety     HPI Timothy [name] presents for follow up on anxiety he states his present medication is working well. Today his blood pressure remains elevated he stated it is because of his drive from [facility] was stressful.  Past Medical History:  Diagnosis Date  . Depression   . ETOH abuse   . Seizure due to alcohol withdrawal, uncomplicated ([facility])    4098    Past Surgical History:  Procedure Laterality Date  . NO PAST SURGERIES      Family History  Problem Relation Age of Onset  . Arthritis Father        rheumatoid  . High blood pressure Father   . Alcoholism Paternal Uncle   . Alcoholism Maternal Uncle     Social History   Socioeconomic History  . Marital status: Married    Spouse name: Not on file  . Number of children: Not on file  . Years of education: Not on file  . Highest education level: Not on file  Occupational History  . Occupation: call center supervisor  Social Needs  . Financial resource strain: Not on file  . Food insecurity    Worry: Not on file    Inability: Not on file  . Transportation needs    Medical: Not on file    Non-medical: Not on file  Tobacco Use  . Smoking status: Former Smoker    Quit date: 01/10/2016    Years since quitting: 2.6  . Smokeless tobacco: Former Systems developer    Types: Chew  Substance and Sexual Activity  . Alcohol use: No    Comment: quit 12/12 - prior heavy  . Drug use: No  . Sexual activity: Not on file  Lifestyle  . Physical activity    Days per week: Not on file    Minutes per session: Not on file  . Stress: Not on file  Relationships  . Social Herbalist on phone: Not on file    Gets together: Not on file    Attends religious service: Not on file    Active member of club or  organization: Not on file    Attends meetings of clubs or organizations: Not on file    Relationship status: Not on file  . Intimate partner violence    Fear of current or ex partner: Not on file    Emotionally abused: Not on file    Physically abused: Not on file    Forced sexual activity: Not on file  Other Topics Concern  . Not on file  Social History Narrative  . Not on file    Outpatient Medications Prior to Visit  Medication Sig Dispense Refill  . acetaminophen (TYLENOL) 500 MG tablet Take 1,000 mg by mouth every 6 (six) hours as needed.    . Multiple Vitamins-Minerals (MULTIVITAMIN) tablet Take 1 tablet daily by mouth. 30 tablet 6  . Melatonin 5 MG TABS Take 1 tablet (5 mg total) by mouth daily. 30 tablet 2  . sertraline (ZOLOFT) 100 MG tablet Take 1 tablet (100 mg total) by mouth daily. 30 tablet 3  . traZODone (DESYREL) 50 MG tablet Take 1 tablet (50 mg total) by mouth daily. 30 tablet 3  . thiamine 100 MG  tablet Take 1 tablet (100 mg total) by mouth daily. (Patient not taking: Reported on 09/09/2018) 30 tablet 6   No facility-administered medications prior to visit.     No Known Allergies  ROS Review of Systems  Psychiatric/Behavioral: Positive for agitation. The patient is nervous/anxious.        Depression      Objective:    Physical Exam  Constitutional: He is oriented to person, place, and time. He appears well-developed and well-nourished.  HENT:  Head: Normocephalic.  Eyes: Pupils are equal, round, and reactive to light. EOM are normal.  Neck: Normal range of motion. Neck supple.  Cardiovascular: Normal rate and regular rhythm.  Pulmonary/Chest: Effort normal and breath sounds normal.  Abdominal: Soft. Bowel sounds are normal.  Musculoskeletal: Normal range of motion.  Neurological: He is oriented to person, place, and time.  Skin: Skin is warm and dry.  Psychiatric: He has a normal mood and affect.    BP 135/87 (BP Location: Left Arm, Patient  Position: Sitting, Cuff Size: Normal)   Pulse 93   Temp 98.2 F (36.8 C) (Tympanic)   Ht '5\' 6"'$  (1.676 m)   Wt 162 lb 12.8 oz (73.8 kg)   SpO2 93%   BMI 26.28 kg/m  Wt Readings from Last 3 Encounters:  09/09/18 162 lb 12.8 oz (73.8 kg)  12/23/17 174 lb 3.2 oz (79 kg)  06/17/17 168 lb 9.6 oz (76.5 kg)     Health Maintenance Due  Topic Date Due  . INFLUENZA VACCINE  09/10/2018    There are no preventive care reminders to display for this patient.  Lab Results  Component Value Date   TSH 1.16 03/16/2016   Lab Results  Component Value Date   WBC 4.2 09/09/2018   HGB 14.3 09/09/2018   HCT 42.3 09/09/2018   MCV 96 09/09/2018   PLT 137 (L) 09/09/2018   Lab Results  Component Value Date   NA 140 09/09/2018   K 4.3 09/09/2018   CO2 22 09/09/2018   GLUCOSE 125 (H) 09/09/2018   BUN 7 09/09/2018   CREATININE 0.79 09/09/2018   BILITOT 0.3 09/09/2018   ALKPHOS 92 09/09/2018   AST 179 (H) 09/09/2018   ALT 122 (H) 09/09/2018   PROT 7.2 09/09/2018   ALBUMIN 4.7 09/09/2018   CALCIUM 9.4 09/09/2018   ANIONGAP 9 01/27/2016   Lab Results  Component Value Date   CHOL 208 (H) 09/09/2018   Lab Results  Component Value Date   HDL 42 09/09/2018   Lab Results  Component Value Date   LDLCALC 127 (H) 09/09/2018   Lab Results  Component Value Date   TRIG 197 (H) 09/09/2018   Lab Results  Component Value Date   CHOLHDL 5.0 09/09/2018   No results found for: HGBA1C    Assessment & Plan:   Problem List Items Addressed This Visit    Medication refill   Relevant Medications   traZODone (DESYREL) 50 MG tablet   Depression - Primary   Relevant Medications   traZODone (DESYREL) 50 MG tablet   sertraline (ZOLOFT) 100 MG tablet   Insomnia   Relevant Medications   traZODone (DESYREL) 50 MG tablet   Melatonin 5 MG TABS    Other Visit Diagnoses    High risk medications (not anticoagulants) long-term use       Relevant Orders   CBC with Differential (Completed)    CMP14+EGFR (Completed)   Lipid Panel (Completed)    Timothy [name] was seen today for  follow-up.  Diagnoses and all orders for this visit:  Current moderate episode of major depressive disorder, unspecified whether recurrent ([place])  We discussed options for treatment of anxiety including therapy and/or medication.  Will check basic labs to ensure thyroid is in normal range and that no other metabolic issues are obvious.  Reviewed concept of anxiety as biochemical imbalance of neurotransmitters and rationale for treatment. Discussed potential risks, expected benefits, possible side effects of the medicine. We also discussed how to take it correctly and dosing instructions. If he has any significant side effects to the medicine, he is to stop it and call for advice.  Instructed patient to contact office or on-call physician promptly should condition worsen or any new symptoms appear.    He was agreeable with this plan.   Spent 25 minutes (>50% of visit) discussing the risks of anxiety disorder, the pathophysiology, etiology, risks, and principles of treatment. -     traZODone (DESYREL) 50 MG tablet; Take 1 tablet (50 mg total) by mouth daily.  Medication refill -     traZODone (DESYREL) 50 MG tablet; Take 1 tablet (50 mg total) by mouth daily.  Insomnia, unspecified type BH INSOMNIA ASSESSMENT:  Reported degree of insomnia:  Severe Insomnia episode began:  More than 1 month ago Insomnia episode progress:  Improving Sleep patterns during the week:    Routine before bed: takes medication and watch TV     Bedroom environment: uncomfortable     Number of nighttime awakenings:  One to two   Difficulty going back to sleep: Yes     Activity when awakened: uses the bathroom     Premature morning awakening: Yes     -     traZODone (DESYREL) 50 MG tablet; Take 1 tablet (50 mg total) by mouth daily. -     Melatonin 5 MG TABS; Take 1 tablet (5 mg total) by mouth daily.  Major depressive disorder  with single episode, in remission [facility]) Depression is when you feel down, blue or sad for at least 2 weeks in a row. You may experience increaset increase in sleeping, eating or  loss of interest in doing things that once gave you pleasure. Feeling worthless, guilty, nervous and low self esteem, avoiding interaction with other people or increase agitation. -     sertraline (ZOLOFT) 100 MG tablet; Take 1 tablet (100 mg total) by mouth daily.  High risk medications (not anticoagulants) long-term use -     CBC with Differential -     CMP14+EGFR -     Lipid Panel  Other orders -     hydrochlorothiazide (MICROZIDE) 12.5 MG capsule; Take 1 capsule (12.5 mg total) by mouth daily.    Meds ordered this encounter  Medications  . traZODone (DESYREL) 50 MG tablet    Sig: Take 1 tablet (50 mg total) by mouth daily.    Dispense:  30 tablet    Refill:  3  . sertraline (ZOLOFT) 100 MG tablet    Sig: Take 1 tablet (100 mg total) by mouth daily.    Dispense:  30 tablet    Refill:  3  . Melatonin 5 MG TABS    Sig: Take 1 tablet (5 mg total) by mouth daily.    Dispense:  30 tablet    Refill:  2  . hydrochlorothiazide (MICROZIDE) 12.5 MG capsule    Sig: Take 1 capsule (12.5 mg total) by mouth daily.    Dispense:  30 capsule  Refill:  3    Follow-up: Return in about 4 weeks (around 10/07/2018) for Bp ck.    Kerin Perna, NP

## 2018-09-09 NOTE — Patient Instructions (Signed)

## 2018-09-10 ENCOUNTER — Encounter (INDEPENDENT_AMBULATORY_CARE_PROVIDER_SITE_OTHER): Payer: Self-pay | Admitting: Primary Care

## 2018-09-10 LAB — CBC WITH DIFFERENTIAL/PLATELET
Basophils Absolute: 0 10*3/uL (ref 0.0–0.2)
Basos: 1 %
EOS (ABSOLUTE): 0.1 10*3/uL (ref 0.0–0.4)
Eos: 3 %
Hematocrit: 42.3 % (ref 37.5–51.0)
Hemoglobin: 14.3 g/dL (ref 13.0–17.7)
Immature Grans (Abs): 0 10*3/uL (ref 0.0–0.1)
Immature Granulocytes: 1 %
Lymphocytes Absolute: 1.3 10*3/uL (ref 0.7–3.1)
Lymphs: 30 %
MCH: 32.5 pg (ref 26.6–33.0)
MCHC: 33.8 g/dL (ref 31.5–35.7)
MCV: 96 fL (ref 79–97)
Monocytes Absolute: 0.6 10*3/uL (ref 0.1–0.9)
Monocytes: 15 %
Neutrophils Absolute: 2.1 10*3/uL (ref 1.4–7.0)
Neutrophils: 50 %
Platelets: 137 10*3/uL — ABNORMAL LOW (ref 150–450)
RBC: 4.4 x10E6/uL (ref 4.14–5.80)
RDW: 12.2 % (ref 11.6–15.4)
WBC: 4.2 10*3/uL (ref 3.4–10.8)

## 2018-09-10 LAB — LIPID PANEL
Chol/HDL Ratio: 5 ratio (ref 0.0–5.0)
Cholesterol, Total: 208 mg/dL — ABNORMAL HIGH (ref 100–199)
HDL: 42 mg/dL (ref >39)
LDL Calculated: 127 mg/dL — ABNORMAL HIGH (ref 0–99)
Triglycerides: 197 mg/dL — ABNORMAL HIGH (ref 0–149)
VLDL Cholesterol Cal: 39 mg/dL (ref 5–40)

## 2018-09-10 LAB — CMP14+EGFR
ALT: 122 IU/L — ABNORMAL HIGH (ref 0–44)
AST: 179 IU/L — ABNORMAL HIGH (ref 0–40)
Albumin/Globulin Ratio: 1.9 (ref 1.2–2.2)
Albumin: 4.7 g/dL (ref 4.0–5.0)
Alkaline Phosphatase: 92 IU/L (ref 39–117)
BUN/Creatinine Ratio: 9 (ref 9–20)
BUN: 7 mg/dL (ref 6–24)
Bilirubin Total: 0.3 mg/dL (ref 0.0–1.2)
CO2: 22 mmol/L (ref 20–29)
Calcium: 9.4 mg/dL (ref 8.7–10.2)
Chloride: 101 mmol/L (ref 96–106)
Creatinine, Ser: 0.79 mg/dL (ref 0.76–1.27)
GFR calc Af Amer: 130 mL/min/{1.73_m2} (ref >59)
GFR calc non Af Amer: 112 mL/min/{1.73_m2} (ref >59)
Globulin, Total: 2.5 g/dL (ref 1.5–4.5)
Glucose: 125 mg/dL — ABNORMAL HIGH (ref 65–99)
Potassium: 4.3 mmol/L (ref 3.5–5.2)
Sodium: 140 mmol/L (ref 134–144)
Total Protein: 7.2 g/dL (ref 6.0–8.5)

## 2018-10-10 ENCOUNTER — Ambulatory Visit (INDEPENDENT_AMBULATORY_CARE_PROVIDER_SITE_OTHER): Payer: Self-pay | Admitting: Primary Care

## 2019-05-30 ENCOUNTER — Telehealth (INDEPENDENT_AMBULATORY_CARE_PROVIDER_SITE_OTHER): Payer: Self-pay | Admitting: Primary Care

## 2019-05-30 DIAGNOSIS — F321 Major depressive disorder, single episode, moderate: Secondary | ICD-10-CM

## 2019-05-30 DIAGNOSIS — Z760 Encounter for issue of repeat prescription: Secondary | ICD-10-CM

## 2019-05-30 DIAGNOSIS — G4700 Insomnia, unspecified: Secondary | ICD-10-CM

## 2019-05-30 DIAGNOSIS — F325 Major depressive disorder, single episode, in full remission: Secondary | ICD-10-CM

## 2019-06-06 ENCOUNTER — Telehealth (INDEPENDENT_AMBULATORY_CARE_PROVIDER_SITE_OTHER): Payer: Self-pay

## 2019-06-06 NOTE — Telephone Encounter (Signed)
Do not see where patient is on BP medication. Trazodone and zoloft not Rx since 08/2018 with 3 refills. Please refill if appropriate.

## 2019-06-06 NOTE — Telephone Encounter (Signed)
Patient called to request a medication refill for his Blood pressure medication as well for  sertraline (ZOLOFT) 100 MG tablet  traZODone (DESYREL) 50 MG tablet   Patient uses  [facility] [address] [place], [place] - [place] [facility] [address]  Please advice [phone]

## 2019-06-07 NOTE — Telephone Encounter (Signed)
Per note patient was to have a follow up appointment to monitor Bp. If he is not checking Bp he needs to be seen for Bp follow up for medication if elevated

## 2019-06-07 NOTE — Telephone Encounter (Signed)
Last seen 09/09/2019 medication has not been refilled since October needs appointment for re-evaluation

## 2019-06-07 NOTE — Telephone Encounter (Signed)
Patient states he does check Bp at home. He is maintaining around 130/85. PCP also did not address refill request for trazodone and zoloft. Please fill if appropriate.

## 2019-06-08 NOTE — Telephone Encounter (Signed)
Patient scheduled for 06/20/2019 at 3:50.

## 2019-06-20 ENCOUNTER — Telehealth (INDEPENDENT_AMBULATORY_CARE_PROVIDER_SITE_OTHER): Payer: Self-pay | Admitting: Primary Care

## 2019-06-20 DIAGNOSIS — R030 Elevated blood-pressure reading, without diagnosis of hypertension: Secondary | ICD-10-CM

## 2019-06-20 DIAGNOSIS — G4700 Insomnia, unspecified: Secondary | ICD-10-CM

## 2019-06-20 DIAGNOSIS — F411 Generalized anxiety disorder: Secondary | ICD-10-CM

## 2019-06-20 MED ORDER — MELATONIN 10 MG/ML PO LIQD: 10.0000 mL | Freq: Every evening | ORAL | 1 refills | Status: AC | PRN

## 2019-06-20 MED ORDER — SERTRALINE HCL 25 MG PO TABS: 25.0000 mg | ORAL_TABLET | Freq: Every day | ORAL | 0 refills | Status: DC

## 2019-06-20 NOTE — Progress Notes (Signed)
Virtual Visit via Telephone Note  I connected with Timothy [name] on 06/20/19 at  3:50 PM EDT by telephone and verified that I am speaking with the correct person using two identifiers.   I discussed the limitations, risks, security and privacy concerns of performing an evaluation and management service by telephone and the availability of in person appointments. I also discussed with the patient that there may be a patient responsible charge related to this service. The patient expressed understanding and agreed to proceed.   History of Present Illness: [name] is a 44 year old Caucasian male that has been requesting medications that he has not been taking on a regular bases or not at all. Discussed each medication than personally did a PHQ. Blood pressure today is 136/85. Discussed guidelines for diagnosis for hypertension .   Past Medical History:  Diagnosis Date  . Depression   . ETOH abuse   . Seizure due to alcohol withdrawal, uncomplicated ([facility])    2004   Current Outpatient Medications on File Prior to Visit  Medication Sig Dispense Refill  . acetaminophen (TYLENOL) 500 MG tablet Take 1,000 mg by mouth every 6 (six) hours as needed.    . Multiple Vitamins-Minerals (MULTIVITAMIN) tablet Take 1 tablet daily by mouth. 30 tablet 6  . thiamine 100 MG tablet Take 1 tablet (100 mg total) by mouth daily. (Patient not taking: Reported on 09/09/2018) 30 tablet 6   No current facility-administered medications on file prior to visit.   Observations/Objective: Review of Systems  Psychiatric/Behavioral: The patient is nervous/anxious and has insomnia.   All other systems reviewed and are negative.   Assessment and Plan: Diagnoses and all orders for this visit:  Generalized anxiety disorder We discussed options for treatment of anxiety including therapy and/or medication. Reviewed concept of anxiety as biochemical imbalance of neurotransmitters and rationale for  treatment. Discussed potential risks, expected benefits, possible side effects of the medicine. We also discussed how to take it correctly and dosing instructions.   Elevated blood-pressure reading, without diagnosis of hypertension Life style modification discussed reading labels, decreasing sodium intake Bp to be less 130/80. Check blood pressure daily at the same time. Keep a log of all blood pressure readings. DASH DIET; No salt or low sodium diet IAvoid smoked meats which are high in sodium content. Avoid soda which contains sodium and are high in sugar which increases your risk for diabetes.    Insomnia, unspecified type Sent in melatonin 10 ml take  at night for sleep. Also  information about good sleep hygiene quiet environment, temperature appealing, tv and music may be distractor.    Follow Up Instructions:    I discussed the assessment and treatment plan with the patient. The patient was provided an opportunity to ask questions and all were answered. The patient agreed with the plan and demonstrated an understanding of the instructions.   The patient was advised to call back or seek an in-person evaluation if the symptoms worsen or if the condition fails to improve as anticipated.  I provided 15 minutes of non-face-to-face time during this encounter.   Grayce Sessions, NP

## 2020-01-11 ENCOUNTER — Ambulatory Visit (INDEPENDENT_AMBULATORY_CARE_PROVIDER_SITE_OTHER): Payer: Self-pay | Admitting: Primary Care

## 2020-01-11 ENCOUNTER — Encounter (INDEPENDENT_AMBULATORY_CARE_PROVIDER_SITE_OTHER): Payer: Self-pay | Admitting: Primary Care

## 2020-01-11 ENCOUNTER — Other Ambulatory Visit: Payer: Self-pay

## 2020-01-11 VITALS — BP 146/85 | HR 96 | Temp 97.5°F | Ht 66.0 in | Wt 143.2 lb

## 2020-01-11 DIAGNOSIS — F4323 Adjustment disorder with mixed anxiety and depressed mood: Secondary | ICD-10-CM

## 2020-01-11 DIAGNOSIS — Z23 Encounter for immunization: Secondary | ICD-10-CM

## 2020-01-11 DIAGNOSIS — F5101 Primary insomnia: Secondary | ICD-10-CM

## 2020-01-11 DIAGNOSIS — I10 Essential (primary) hypertension: Secondary | ICD-10-CM

## 2020-01-11 DIAGNOSIS — F1021 Alcohol dependence, in remission: Secondary | ICD-10-CM

## 2020-01-11 MED ORDER — SERTRALINE HCL 25 MG PO TABS: 25.0000 mg | ORAL_TABLET | Freq: Every day | ORAL | 0 refills | Status: DC

## 2020-01-11 MED ORDER — HYDROCHLOROTHIAZIDE 25 MG PO TABS: 25.0000 mg | ORAL_TABLET | Freq: Every day | ORAL | 3 refills | Status: AC

## 2020-01-11 NOTE — Patient Instructions (Addendum)
Influenza, Adult Influenza is also called "the flu." It is an infection in the lungs, nose, and throat (respiratory tract). It is caused by a virus. The flu causes symptoms that are similar to symptoms of a cold. It also causes a high fever and body aches. The flu spreads easily from person to person (is contagious). Getting a flu shot (influenza vaccination) every year is the best way to prevent the flu. What are the causes? This condition is caused by the influenza virus. You can get the virus by:  Breathing in droplets that are in the air from the cough or sneeze of a person who has the virus.  Touching something that has the virus on it (is contaminated) and then touching your mouth, nose, or eyes. What increases the risk? Certain things may make you more likely to get the flu. These include:  Not washing your hands often.  Having close contact with many people during cold and flu season.  Touching your mouth, eyes, or nose without first washing your hands.  Not getting a flu shot every year. You may have a higher risk for the flu, along with serious problems such as a lung infection (pneumonia), if you:  Are older than 65.  Are pregnant.  Have a weakened disease-fighting system (immune system) because of a disease or taking certain medicines.  Have a long-term (chronic) illness, such as: ? Heart, kidney, or lung disease. ? Diabetes. ? Asthma.  Have a liver disorder.  Are very overweight (morbidly obese).  Have anemia. This is a condition that affects your red blood cells. What are the signs or symptoms? Symptoms usually begin suddenly and last 4-14 days. They may include:  Fever and chills.  Headaches, body aches, or muscle aches.  Sore throat.  Cough.  Runny or stuffy (congested) nose.  Chest discomfort.  Not wanting to eat as much as normal (poor appetite).  Weakness or feeling tired (fatigue).  Dizziness.  Feeling sick to your stomach (nauseous) or  throwing up (vomiting). How is this treated? If the flu is found early, you can be treated with medicine that can help reduce how bad the illness is and how long it lasts (antiviral medicine). This may be given by mouth (orally) or through an IV tube. Taking care of yourself at home can help your symptoms get better. Your doctor may suggest:  Taking over-the-counter medicines.  Drinking plenty of fluids. The flu often goes away on its own. If you have very bad symptoms or other problems, you may be treated in a hospital. Follow these instructions at home:     Activity  Rest as needed. Get plenty of sleep.  Stay home from work or school as told by your doctor. ? Do not leave home until you do not have a fever for 24 hours without taking medicine. ? Leave home only to visit your doctor. Eating and drinking  Take an ORS (oral rehydration solution). This is a drink that is sold at pharmacies and stores.  Drink enough fluid to keep your pee (urine) pale yellow.  Drink clear fluids in small amounts as you are able. Clear fluids include: ? Water. ? Ice chips. ? Fruit juice that has water added (diluted fruit juice). ? Low-calorie sports drinks.  Eat bland, easy-to-digest foods in small amounts as you are able. These foods include: ? Bananas. ? Applesauce. ? Rice. ? Lean meats. ? Toast. ? Crackers.  Do not eat or drink: ? Fluids that have a lot   of sugar or caffeine. ? Alcohol. ? Spicy or fatty foods. General instructions  Take over-the-counter and prescription medicines only as told by your doctor.  Use a cool mist humidifier to add moisture to the air in your home. This can make it easier for you to breathe.  Cover your mouth and nose when you cough or sneeze.  Wash your hands with soap and water often, especially after you cough or sneeze. If you cannot use soap and water, use alcohol-based hand sanitizer.  Keep all follow-up visits as told by your doctor. This is  important. How is this prevented?   Get a flu shot every year. You may get the flu shot in late summer, fall, or winter. Ask your doctor when you should get your flu shot.  Avoid contact with people who are sick during fall and winter (cold and flu season). Contact a doctor if:  You get new symptoms.  You have: ? Chest pain. ? Watery poop (diarrhea). ? A fever.  Your cough gets worse.  You start to have more mucus.  You feel sick to your stomach.  You throw up. Get help right away if you:  Have shortness of breath.  Have trouble breathing.  Have skin or nails that turn a bluish color.  Have very bad pain or stiffness in your neck.  Get a sudden headache.  Get sudden pain in your face or ear.  Cannot eat or drink without throwing up. Summary  Influenza ("the flu") is an infection in the lungs, nose, and throat. It is caused by a virus.  Take over-the-counter and prescription medicines only as told by your doctor.  Getting a flu shot every year is the best way to avoid getting the flu. This information is not intended to replace advice given to you by your health care provider. Make sure you discuss any questions you have with your health care provider. Document Revised: 07/14/2017 Document Reviewed: 07/14/2017 Elsevier Patient Education  [employer].  Preventing Hypertension Hypertension, commonly called high blood pressure, is when the force of blood pumping through the arteries is too strong. Arteries are blood vessels that carry blood from the heart throughout the body. Over time, hypertension can damage the arteries and decrease blood flow to important parts of the body, including the brain, heart, and kidneys. Often, hypertension does not cause symptoms until blood pressure is very high. For this reason, it is important to have your blood pressure checked on a regular basis. Hypertension can often be prevented with diet and lifestyle changes. If you  already have hypertension, you can control it with diet and lifestyle changes, as well as medicine. What nutrition changes can be made? Maintain a healthy diet. This includes:  Eating less salt (sodium). Ask your health care provider how much sodium is safe for you to have. The general recommendation is to consume less than 1 tsp (2,300 mg) of sodium a day. ? Do not add salt to your food. ? Choose low-sodium options when grocery shopping and eating out.  Limiting fats in your diet. You can do this by eating low-fat or fat-free dairy products and by eating less red meat.  Eating more fruits, vegetables, and whole grains. Make a goal to eat: ? 1-2 cups of fresh fruits and vegetables each day. ? 3-4 servings of whole grains each day.  Avoiding foods and beverages that have added sugars.  Eating fish that contain healthy fats (omega-3 fatty acids), such as mackerel or salmon.  If you need help putting together a healthy eating plan, try the DASH diet. This diet is high in fruits, vegetables, and whole grains. It is low in sodium, red meat, and added sugars. DASH stands for Dietary Approaches to Stop Hypertension. What lifestyle changes can be made?   Lose weight if you are overweight. Losing just 3?5% of your body weight can help prevent or control hypertension. ? For example, if your present weight is 200 lb (91 kg), a loss of 3-5% of your weight means losing 6-10 lb (2.7-4.5 kg). ? Ask your health care provider to help you with a diet and exercise plan to safely lose weight.  Get enough exercise. Do at least 150 minutes of moderate-intensity exercise each week. ? You could do this in short exercise sessions several times a day, or you could do longer exercise sessions a few times a week. For example, you could take a brisk 10-minute walk or bike ride, 3 times a day, for 5 days a week.  Find ways to reduce stress, such as exercising, meditating, listening to music, or taking a yoga class. If  you need help reducing stress, ask your health care provider.  Do not smoke. This includes e-cigarettes. Chemicals in tobacco and nicotine products raise your blood pressure each time you smoke. If you need help quitting, ask your health care provider.  Avoid alcohol. If you drink alcohol, limit alcohol intake to no more than 1 drink a day for nonpregnant women and 2 drinks a day for men. One drink equals 12 oz of beer, 5 oz of wine, or 1 oz of hard liquor. Why are these changes important? Diet and lifestyle changes can help you prevent hypertension, and they may make you feel better overall and improve your quality of life. If you have hypertension, making these changes will help you control it and help prevent major complications, such as:  Hardening and narrowing of arteries that supply blood to: ? Your heart. This can cause a heart attack. ? Your brain. This can cause a stroke. ? Your kidneys. This can cause kidney failure.  Stress on your heart muscle, which can cause heart failure. What can I do to lower my risk?  Work with your health care provider to make a hypertension prevention plan that works for you. Follow your plan and keep all follow-up visits as told by your health care provider.  Learn how to check your blood pressure at home. Make sure that you know your personal target blood pressure, as told by your health care provider. How is this treated? In addition to diet and lifestyle changes, your health care provider may recommend medicines to help lower your blood pressure. You may need to try a few different medicines to find what works best for you. You also may need to take more than one medicine. Take over-the-counter and prescription medicines only as told by your health care provider. Where to find support Your health care provider can help you prevent hypertension and help you keep your blood pressure at a healthy level. Your local hospital or your community may also  provide support services and prevention programs. The American Heart Association offers an online support network at: http://supportnetwork.heart.org/high-blood-pressure Where to find more information Learn more about hypertension from:  National Heart, Lung, and Blood Institute: www.nhlbi.nih.gov/health/health-topics/topics/hbp  Centers for Disease Control and Prevention: www.cdc.gov/bloodpressure  American Academy of Family Physicians: http://familydoctor.org/familydoctor/en/diseases-conditions/high-blood-pressure.printerview.all.html Learn more about the DASH diet from:  National Heart, Lung, and Blood Institute: www.nhlbi.nih.gov/health/health-topics/topics/dash   Contact a health care provider if:  You think you are having a reaction to medicines you have taken.  You have recurrent headaches or feel dizzy.  You have swelling in your ankles.  You have trouble with your vision. Summary  Hypertension often does not cause any symptoms until blood pressure is very high. It is important to get your blood pressure checked regularly.  Diet and lifestyle changes are the most important steps in preventing hypertension.  By keeping your blood pressure in a healthy range, you can prevent complications like heart attack, heart failure, stroke, and kidney failure.  Work with your health care provider to make a hypertension prevention plan that works for you. This information is not intended to replace advice given to you by your health care provider. Make sure you discuss any questions you have with your health care provider. Document Revised: 05/20/2018 Document Reviewed: 10/07/2015 Elsevier Patient Education  2020 Elsevier Inc.  

## 2020-01-11 NOTE — Progress Notes (Signed)
Established Patient Office Visit  Subjective:  Patient ID: Timothy [name], male    DOB: 1975-05-14  Age: 44 y.o. MRN: [id number]  CC:  Chief Complaint  Patient presents with  . Seizures    HPI [name] is a 44 year old male who presents for several concerns wife wrote a list of concerns . Seizure with a dx hx of this is his 3rd seizure in 7 years -would need to be evaluated by neurologist wife concern about driving. As it stands I am not a neurologist but 3 seizures in 7 years unable to state if he is a driving risk. Patient actual concern is anxiety and depression with insomnia. He has a promotion at work that has more money and responsibilities. Stop Zoloft a weaning process.   Past Medical History:  Diagnosis Date  . Depression   . ETOH abuse   . 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 NO PAST SURGERIES      Family History  Problem Relation Age of Onset  . Arthritis Father        rheumatoid  . High blood pressure Father   . Alcoholism Paternal Uncle   . Alcoholism Maternal Uncle     Social History   Socioeconomic History  . Marital status: Married    Spouse name: Not on file  . Number of children: Not on file  . Years of education: Not on file  . Highest education level: Not on file  Occupational History  . Occupation: call center supervisor  Tobacco Use  . Smoking status: Former Smoker    Quit date: 01/10/2016    Years since quitting: 4.0  . Smokeless tobacco: Former Systems developer    Types: Chew  Substance and Sexual Activity  . Alcohol use: No    Comment: quit 12/12 - prior heavy  . Drug use: No  . Sexual activity: Not on file  Other Topics Concern  . Not on file  Social History Narrative  . Not on file   Social Determinants of Health   Financial Resource Strain:   . Difficulty of Paying Living Expenses: Not on file  Food Insecurity:   . Worried About  Charity fundraiser in the Last Year: Not on file  . Ran Out of Food in the Last Year: Not on file  Transportation Needs:   . Lack of Transportation (Medical): Not on file  . Lack of Transportation (Non-Medical): Not on file  Physical Activity:   . Days of Exercise per Week: Not on file  . Minutes of Exercise per Session: Not on file  Stress:   . Feeling of Stress : Not on file  Social Connections:   . Frequency of Communication with Friends and Family: Not on file  . Frequency of Social Gatherings with Friends and Family: Not on file  . Attends Religious Services: Not on file  . Active Member of Clubs or Organizations: Not on file  . Attends Archivist Meetings: Not on file  . Marital Status: Not on file  Intimate Partner Violence:   . Fear of Current or Ex-Partner: Not on file  . Emotionally Abused: Not on file  . Physically Abused: Not on file  . Sexually Abused: Not on file    Outpatient Medications Prior to Visit  Medication Sig Dispense Refill  . acetaminophen (TYLENOL) 500 MG tablet Take 1,000 mg  by mouth every 6 (six) hours as needed.    . Melatonin 10 MG/ML LIQD Take 10 mLs by mouth at bedtime and may repeat dose one time if needed. Take 57ml at bedtime as needed for sleep. May repeat dose once if not effective. Maximin of 10 ml as needed at bedtime (Patient not taking: Reported on 01/11/2020) 59 mL 1  . thiamine 100 MG tablet Take 1 tablet (100 mg total) by mouth daily. (Patient not taking: Reported on 09/09/2018) 30 tablet 6  . Multiple Vitamins-Minerals (MULTIVITAMIN) tablet Take 1 tablet daily by mouth. 30 tablet 6  . sertraline (ZOLOFT) 25 MG tablet Take 1 tablet (25 mg total) by mouth daily. (Patient not taking: Reported on 01/11/2020) 90 tablet 0   No facility-administered medications prior to visit.   We 3-4 beers  No Known Allergies  ROS Review of Systems  All other systems reviewed and are negative.     Objective:    Physical Exam Vitals  reviewed.  Constitutional:      Appearance: He is normal weight.  HENT:     Head: Normocephalic.     Right Ear: Tympanic membrane normal.     Left Ear: Tympanic membrane normal.     Nose: Nose normal.  Eyes:     Extraocular Movements: Extraocular movements intact.     Pupils: Pupils are equal, round, and reactive to light.  Cardiovascular:     Rate and Rhythm: Normal rate and regular rhythm.  Pulmonary:     Effort: Pulmonary effort is normal.     Breath sounds: Normal breath sounds.  Abdominal:     General: Bowel sounds are normal.     Palpations: Abdomen is soft.  Musculoskeletal:        General: Normal range of motion.     Cervical back: Normal range of motion.  Skin:    General: Skin is warm and dry.  Neurological:     Mental Status: He is alert and oriented to person, place, and time.  Psychiatric:        Mood and Affect: Mood normal.        Behavior: Behavior normal.        Thought Content: Thought content normal.        Judgment: Judgment normal.     BP (!) 146/85 (BP Location: Right Arm, Patient Position: Sitting, Cuff Size: Normal)   Pulse 96   Temp (!) 97.5 F (36.4 C) (Temporal)   Ht 5\' 6"  (1.676 m)   Wt 143 lb 3.2 oz (65 kg)   SpO2 97%   BMI 23.11 kg/m  Wt Readings from Last 3 Encounters:  01/11/20 143 lb 3.2 oz (65 kg)  09/09/18 162 lb 12.8 oz (73.8 kg)  12/23/17 174 lb 3.2 oz (79 kg)     Health Maintenance Due  Topic Date Due  . COVID-19 Vaccine (1) Never done    There are no preventive care reminders to display for this patient.  Lab Results  Component Value Date   TSH 1.16 03/16/2016   Lab Results  Component Value Date   WBC 5.2 01/11/2020   HGB 13.5 01/11/2020   HCT 39.5 01/11/2020   MCV 97 01/11/2020   PLT 130 (L) 01/11/2020   Lab Results  Component Value Date   NA 145 (H) 01/11/2020   K 3.7 01/11/2020   CO2 22 01/11/2020   GLUCOSE 95 01/11/2020   BUN 6 01/11/2020   CREATININE 0.59 (L) 01/11/2020   BILITOT 0.7 01/11/2020  ALKPHOS 95 01/11/2020   AST 248 (H) 01/11/2020   ALT 155 (H) 01/11/2020   PROT 7.8 01/11/2020   ALBUMIN 4.8 01/11/2020   CALCIUM 9.2 01/11/2020   ANIONGAP 9 01/27/2016   Lab Results  Component Value Date   CHOL 221 (H) 01/11/2020   Lab Results  Component Value Date   HDL 65 01/11/2020   Lab Results  Component Value Date   LDLCALC 144 (H) 01/11/2020   Lab Results  Component Value Date   TRIG 71 01/11/2020   Lab Results  Component Value Date   CHOLHDL 3.4 01/11/2020   No results found for: HGBA1C    Assessment & Plan:  Jhayden was seen today for seizures.  Diagnoses and all orders for this visit:  Need for immunization against influenza Health care maintenance and care gap -     Flu Vaccine QUAD 36+ mos IM  Primary insomnia -     sertraline (ZOLOFT) 25 MG tablet; Take 1 tablet (25 mg total) by mouth daily.  Alcohol dependence in remission ([place]) -     Lipid Panel  Adjustment disorder with mixed anxiety and depressed mood -     sertraline (ZOLOFT) 25 MG tablet; Take 1 tablet (25 mg total) by mouth daily.  Essential hypertension Counseled on blood pressure goal of less than 130/80, low-sodium, DASH diet, medication compliance, 150 minutes of moderate intensity exercise per week. Discussed medication compliance, adverse effects. -     hydrochlorothiazide (HYDRODIURIL) 25 MG tablet; Take 1 tablet (25 mg total) by mouth daily. -     CBC with Differential -     CMP14+EGFR   Meds ordered this encounter  Medications  . sertraline (ZOLOFT) 25 MG tablet    Sig: Take 1 tablet (25 mg total) by mouth daily.    Dispense:  90 tablet    Refill:  0  . hydrochlorothiazide (HYDRODIURIL) 25 MG tablet    Sig: Take 1 tablet (25 mg total) by mouth daily.    Dispense:  90 tablet    Refill:  3    Follow-up: Return in about 6 weeks (around 02/22/2020) for tele Bp check.    Kerin Perna, NP

## 2020-01-12 LAB — CBC WITH DIFFERENTIAL/PLATELET
Basophils Absolute: 0.1 10*3/uL (ref 0.0–0.2)
Basos: 1 %
EOS (ABSOLUTE): 0.1 10*3/uL (ref 0.0–0.4)
Eos: 2 %
Hematocrit: 39.5 % (ref 37.5–51.0)
Hemoglobin: 13.5 g/dL (ref 13.0–17.7)
Immature Grans (Abs): 0 10*3/uL (ref 0.0–0.1)
Immature Granulocytes: 0 %
Lymphocytes Absolute: 1.2 10*3/uL (ref 0.7–3.1)
Lymphs: 23 %
MCH: 33.2 pg — ABNORMAL HIGH (ref 26.6–33.0)
MCHC: 34.2 g/dL (ref 31.5–35.7)
MCV: 97 fL (ref 79–97)
Monocytes Absolute: 0.7 10*3/uL (ref 0.1–0.9)
Monocytes: 13 %
Neutrophils Absolute: 3.2 10*3/uL (ref 1.4–7.0)
Neutrophils: 61 %
Platelets: 130 10*3/uL — ABNORMAL LOW (ref 150–450)
RBC: 4.07 x10E6/uL — ABNORMAL LOW (ref 4.14–5.80)
RDW: 11.8 % (ref 11.6–15.4)
WBC: 5.2 10*3/uL (ref 3.4–10.8)

## 2020-01-12 LAB — CMP14+EGFR
ALT: 155 IU/L — ABNORMAL HIGH (ref 0–44)
AST: 248 IU/L — ABNORMAL HIGH (ref 0–40)
Albumin/Globulin Ratio: 1.6 (ref 1.2–2.2)
Albumin: 4.8 g/dL (ref 4.0–5.0)
Alkaline Phosphatase: 95 IU/L (ref 44–121)
BUN/Creatinine Ratio: 10 (ref 9–20)
BUN: 6 mg/dL (ref 6–24)
Bilirubin Total: 0.7 mg/dL (ref 0.0–1.2)
CO2: 22 mmol/L (ref 20–29)
Calcium: 9.2 mg/dL (ref 8.7–10.2)
Chloride: 103 mmol/L (ref 96–106)
Creatinine, Ser: 0.59 mg/dL — ABNORMAL LOW (ref 0.76–1.27)
GFR calc Af Amer: 144 mL/min/{1.73_m2} (ref >59)
GFR calc non Af Amer: 125 mL/min/{1.73_m2} (ref >59)
Globulin, Total: 3 g/dL (ref 1.5–4.5)
Glucose: 95 mg/dL (ref 65–99)
Potassium: 3.7 mmol/L (ref 3.5–5.2)
Sodium: 145 mmol/L — ABNORMAL HIGH (ref 134–144)
Total Protein: 7.8 g/dL (ref 6.0–8.5)

## 2020-01-12 LAB — LIPID PANEL
Chol/HDL Ratio: 3.4 ratio (ref 0.0–5.0)
Cholesterol, Total: 221 mg/dL — ABNORMAL HIGH (ref 100–199)
HDL: 65 mg/dL (ref >39)
LDL Chol Calc (NIH): 144 mg/dL — ABNORMAL HIGH (ref 0–99)
Triglycerides: 71 mg/dL (ref 0–149)
VLDL Cholesterol Cal: 12 mg/dL (ref 5–40)

## 2020-01-16 ENCOUNTER — Other Ambulatory Visit (INDEPENDENT_AMBULATORY_CARE_PROVIDER_SITE_OTHER): Payer: Self-pay | Admitting: Primary Care

## 2020-01-16 DIAGNOSIS — E782 Mixed hyperlipidemia: Secondary | ICD-10-CM

## 2020-01-16 MED ORDER — PRAVASTATIN SODIUM 40 MG PO TABS: 40.0000 mg | ORAL_TABLET | Freq: Every day | ORAL | 3 refills | Status: AC

## 2020-02-05 ENCOUNTER — Telehealth (INDEPENDENT_AMBULATORY_CARE_PROVIDER_SITE_OTHER): Payer: Self-pay | Admitting: Primary Care

## 2020-02-05 NOTE — Telephone Encounter (Signed)
Pts wife called stating that the pt has been experiencing memory loss, depression, and delusion. She states that these symptoms did not start until the pt took hydrochlorothiazide and sertraline. She is requesting to have PCP give a call back. Please advise.

## 2020-02-06 ENCOUNTER — Ambulatory Visit: Payer: Self-pay

## 2020-02-06 NOTE — Telephone Encounter (Signed)
Left VM to return call to office 

## 2020-02-06 NOTE — Telephone Encounter (Signed)
Left VM asking patient to return call to office.

## 2020-02-06 NOTE — Telephone Encounter (Signed)
Wife reports pt. Has had "delusions and hallucinations x 2 weeks." "I don't know if it's his medication." Does reports he "drinks  Alcohol a lot." Spoke with Malachi Bonds in the practice and provider is not in the practice today.Instructs pt. Should go to ED/UC as needed. Wife would like to speak with provider when possible. Please advise. Reason for Disposition . [1] Acting confused (e.g., disoriented, slurred speech) AND [2] brief (now gone)  Answer Assessment - Initial Assessment Questions 1. LEVEL OF CONSCIOUSNESS: "How is he (she, the patient) acting right now?" (e.g., alert-oriented, confused, lethargic, stuporous, comatose)     Alert and oriented 2. ONSET: "When did the confusion start?"  (minutes, hours, days)     01/26/20 3. PATTERN "Does this come and go, or has it been constant since it started?"  "Is it present now?"     Comes and goes 4. ALCOHOL or DRUGS: "Has he been drinking alcohol or taking any drugs?"      Alcohol 5. NARCOTIC MEDICATIONS: "Has he been receiving any narcotic medications?" (e.g., morphine, Vicodin)     No 6. CAUSE: "What do you think is causing the confusion?"      Alcohol 7. OTHER SYMPTOMS: "Are there any other symptoms?" (e.g., difficulty breathing, headache, fever, weakness)     Mood swings  Protocols used: CONFUSION - DELIRIUM-A-AH

## 2020-02-09 NOTE — Telephone Encounter (Signed)
Left VM asking patient to return call to office.

## 2020-02-22 ENCOUNTER — Telehealth (INDEPENDENT_AMBULATORY_CARE_PROVIDER_SITE_OTHER): Payer: Self-pay | Admitting: Primary Care

## 2020-04-20 ENCOUNTER — Other Ambulatory Visit (INDEPENDENT_AMBULATORY_CARE_PROVIDER_SITE_OTHER): Payer: Self-pay | Admitting: Primary Care

## 2020-04-20 DIAGNOSIS — F4323 Adjustment disorder with mixed anxiety and depressed mood: Secondary | ICD-10-CM

## 2020-04-20 DIAGNOSIS — F5101 Primary insomnia: Secondary | ICD-10-CM

## 2020-04-20 NOTE — Telephone Encounter (Signed)
Requested Prescriptions  Pending Prescriptions Disp Refills  . sertraline (ZOLOFT) 25 MG tablet [Pharmacy Med Name: Sertraline HCl 25 MG Oral Tablet] 90 tablet 0    Sig: Take 1 tablet by mouth once daily     Psychiatry:  Antidepressants - SSRI Passed - 04/20/2020 12:08 PM      Passed - Completed PHQ-2 or PHQ-9 in the last 360 days      Passed - Valid encounter within last 6 months    Recent Outpatient Visits          3 months ago Need for immunization against influenza   [facility] Grayce Sessions, NP   10 months ago Generalized anxiety disorder   [facility] Gwinda Passe P, NP   1 year ago Current moderate episode of major depressive disorder, unspecified whether recurrent (HCC)   [facility] Grayce Sessions, NP   1 year ago Medication refill   [facility] Grayce Sessions, NP   2 years ago Major depressive disorder with single episode, in remission [facility])   [facility] Loletta Specter, PA-C
